# Patient Record
Sex: Female | Born: 1990 | Race: White | Hispanic: No | Marital: Married | Smoking: Never smoker
Health system: Southern US, Community
[De-identification: ages and names within clinical notes are randomized; demographics above are authoritative.]

## PROBLEM LIST (undated history)

## (undated) DIAGNOSIS — L4050 Arthropathic psoriasis, unspecified: Secondary | ICD-10-CM

## (undated) DIAGNOSIS — L409 Psoriasis, unspecified: Secondary | ICD-10-CM

## (undated) DIAGNOSIS — M419 Scoliosis, unspecified: Secondary | ICD-10-CM

## (undated) HISTORY — PX: NASAL SINUS SURGERY: SHX719

## (undated) HISTORY — DX: Psoriasis, unspecified: L40.9

## (undated) HISTORY — DX: Scoliosis, unspecified: M41.9

## (undated) HISTORY — PX: NASAL SEPTUM SURGERY: SHX37

## (undated) HISTORY — DX: Arthropathic psoriasis, unspecified: L40.50

---

## 2014-08-25 LAB — LIPID PANEL
CHOLESTEROL: 146 mg/dL (ref 0–200)
HDL: 60 mg/dL (ref 35–70)
LDL CALC: 76 mg/dL
Triglycerides: 50 mg/dL (ref 40–160)

## 2014-08-25 LAB — BASIC METABOLIC PANEL: Glucose: 84 mg/dL

## 2016-02-05 ENCOUNTER — Encounter: Payer: Self-pay | Admitting: Obstetrics & Gynecology

## 2016-02-05 ENCOUNTER — Ambulatory Visit (INDEPENDENT_AMBULATORY_CARE_PROVIDER_SITE_OTHER): Payer: BLUE CROSS/BLUE SHIELD | Admitting: Obstetrics & Gynecology

## 2016-02-05 VITALS — BP 118/65 | HR 65 | Resp 16 | Ht 63.0 in | Wt 118.0 lb

## 2016-02-05 DIAGNOSIS — Z124 Encounter for screening for malignant neoplasm of cervix: Secondary | ICD-10-CM

## 2016-02-05 DIAGNOSIS — Z3041 Encounter for surveillance of contraceptive pills: Secondary | ICD-10-CM | POA: Diagnosis not present

## 2016-02-05 DIAGNOSIS — L409 Psoriasis, unspecified: Secondary | ICD-10-CM | POA: Diagnosis not present

## 2016-02-05 DIAGNOSIS — Z01419 Encounter for gynecological examination (general) (routine) without abnormal findings: Secondary | ICD-10-CM | POA: Diagnosis not present

## 2016-02-05 MED ORDER — MISOPROSTOL 200 MCG PO TABS: ORAL_TABLET | ORAL | Status: DC

## 2016-02-05 NOTE — Progress Notes (Signed)
  Subjective:     Maria Kelly is a 25 y.o. female here for a routine exam.  Current complaints: Thinking of IUD instead of pills. Has regualr menses each month with no problems  Gynecologic History Patient's last menstrual period was 01/15/2016. Contraception: OCP (estrogen/progesterone) Last Pap: 2015 in [place]. Results were: normal per pt  Obstetric History OB History  Gravida Para Term Preterm AB SAB TAB Ectopic Multiple Living  0 0 0 0 0 0 0 0 0 0          The following portions of the patient's history were reviewed and updated as appropriate: allergies, current medications, past family history, past medical history, past social history, past surgical history and problem list.  Review of Systems Pertinent items noted in HPI and remainder of comprehensive ROS otherwise negative.    Objective:      Filed Vitals:   02/05/16 1508  BP: 118/65  Pulse: 65  Resp: 16  Height: 5\' 3"  (1.6 m)  Weight: 118 lb (53.524 kg)   Vitals:  WNL General appearance: alert, cooperative and no distress  HEENT: Normocephalic, without obvious abnormality, atraumatic Eyes: negative Throat: lips, mucosa, and tongue normal; teeth and gums normal  Respiratory: Clear to auscultation bilaterally  CV: Regular rate and rhythm  Breasts:  Normal appearance, no masses or tenderness, no nipple retraction or dimpling  GI: Soft, non-tender; bowel sounds normal; no masses,  no organomegaly  GU: External Genitalia:  Tanner V, no lesion Urethra:  No prolapse   Vagina: Pink, normal rugae, no blood or discharge  Cervix: No CMT, no lesion  Uterus:  Normal size and contour, non tender  Adnexa: Normal, no masses, non tender  Musculoskeletal: No edema, redness or tenderness in the calves or thighs  Skin: No lesions or rash  Lymphatic: Axillary adenopathy: none    Psychiatric: Normal mood and behavior     Assessment:    Healthy female exam.   Wants Skyla   Plan:    Education reviewed: skin  cancer screening. Contraception: OCP (estrogen/progesterone). Follow up in: 2 weeks.    [facility] Aleve prior to insertion.

## 2016-02-07 LAB — CYTOLOGY - PAP

## 2016-02-13 ENCOUNTER — Ambulatory Visit: Payer: BLUE CROSS/BLUE SHIELD | Admitting: Obstetrics & Gynecology

## 2016-04-15 ENCOUNTER — Encounter: Payer: Self-pay | Admitting: Certified Nurse Midwife

## 2016-04-15 ENCOUNTER — Ambulatory Visit (INDEPENDENT_AMBULATORY_CARE_PROVIDER_SITE_OTHER): Payer: BLUE CROSS/BLUE SHIELD | Admitting: Certified Nurse Midwife

## 2016-04-15 VITALS — BP 130/78 | HR 73 | Wt 115.0 lb

## 2016-04-15 DIAGNOSIS — Z113 Encounter for screening for infections with a predominantly sexual mode of transmission: Secondary | ICD-10-CM | POA: Diagnosis not present

## 2016-04-15 DIAGNOSIS — Z36 Encounter for antenatal screening of mother: Secondary | ICD-10-CM | POA: Diagnosis not present

## 2016-04-15 DIAGNOSIS — O9989 Other specified diseases and conditions complicating pregnancy, childbirth and the puerperium: Secondary | ICD-10-CM | POA: Diagnosis not present

## 2016-04-15 DIAGNOSIS — Z23 Encounter for immunization: Secondary | ICD-10-CM

## 2016-04-15 DIAGNOSIS — Z331 Pregnant state, incidental: Secondary | ICD-10-CM | POA: Diagnosis not present

## 2016-04-15 DIAGNOSIS — O26899 Other specified pregnancy related conditions, unspecified trimester: Secondary | ICD-10-CM | POA: Insufficient documentation

## 2016-04-15 DIAGNOSIS — R109 Unspecified abdominal pain: Secondary | ICD-10-CM

## 2016-04-15 DIAGNOSIS — Z3490 Encounter for supervision of normal pregnancy, unspecified, unspecified trimester: Secondary | ICD-10-CM

## 2016-04-15 DIAGNOSIS — N912 Amenorrhea, unspecified: Secondary | ICD-10-CM | POA: Insufficient documentation

## 2016-04-15 DIAGNOSIS — Z3200 Encounter for pregnancy test, result unknown: Secondary | ICD-10-CM

## 2016-04-15 NOTE — Progress Notes (Signed)
Subjective:     Maria Kelly is a 25 y.o. female here for confirmation of pregnancy.  Current complaints: none. She denies VB. She reports mild cramping. She denies C/D/N/V. She report abdominal bloating and fatigue.   Gynecologic History Patient's last menstrual period was 03/11/2016 (exact date). Contraception: none Last Pap: 02/05/16. Results were: normal Last mammogram: n/a.   Obstetric History OB History  Gravida Para Term Preterm AB Living  1 0 0 0 0 0  SAB TAB Ectopic Multiple Live Births  0 0 0 0      # Outcome Date GA Lbr Len/2nd Weight Sex Delivery Anes PTL Lv  1 Current              The following portions of the patient's history were reviewed and updated as appropriate: allergies, current medications, past family history, past medical history, past social history, past surgical history and problem list.  Review of Systems Pertinent items noted in HPI and remainder of comprehensive ROS otherwise negative.   +abd bloating  +fatigue  +cramping   no VB  Objective:  BP 130/78   Pulse 73   Wt 115 lb (52.2 kg)   LMP 03/11/2016 (Exact Date)   BMI 20.37 kg/m    General appearance: alert, cooperative and no distress Lungs: normal effort and rate Heart: normal rate Abdomen: normal findings: soft, non-tender Skin: Skin color, texture, turgor normal. No rashes or lesions Neurologic: Grossly normal   Bedside US by RN: +IUGS, +YS, no FP, measures ~5w  Assessment:  Early pregnancy Cramping in pregnancy   Plan:  Follow up in 7-10 days for rpt US SAB precautions - First trimester anticipatory guidance - Continue PNV daily  >50% of this 15 min visit spent counseling

## 2016-04-15 NOTE — Progress Notes (Signed)
Pt here today for NOB.  According to LMP and bedside U/S she is only [redacted] weeks pregnant.  Only Gestational sac and yolk sac seen.  Pt will return in 2 weeks for confirmation

## 2016-04-16 LAB — GC/CHLAMYDIA PROBE AMP
CT Probe RNA: NOT DETECTED
GC Probe RNA: NOT DETECTED

## 2016-04-17 LAB — URINE CULTURE: Organism ID, Bacteria: NO GROWTH

## 2016-04-29 ENCOUNTER — Encounter: Payer: Self-pay | Admitting: Obstetrics & Gynecology

## 2016-04-29 ENCOUNTER — Other Ambulatory Visit: Payer: Self-pay | Admitting: Certified Nurse Midwife

## 2016-04-29 ENCOUNTER — Ambulatory Visit (INDEPENDENT_AMBULATORY_CARE_PROVIDER_SITE_OTHER): Payer: BLUE CROSS/BLUE SHIELD | Admitting: Certified Nurse Midwife

## 2016-04-29 VITALS — BP 105/65 | HR 63 | Wt 116.0 lb

## 2016-04-29 DIAGNOSIS — Z3A11 11 weeks gestation of pregnancy: Secondary | ICD-10-CM

## 2016-04-29 DIAGNOSIS — Z3401 Encounter for supervision of normal first pregnancy, first trimester: Secondary | ICD-10-CM

## 2016-04-29 DIAGNOSIS — Z113 Encounter for screening for infections with a predominantly sexual mode of transmission: Secondary | ICD-10-CM | POA: Diagnosis not present

## 2016-04-29 DIAGNOSIS — Z3400 Encounter for supervision of normal first pregnancy, unspecified trimester: Secondary | ICD-10-CM | POA: Insufficient documentation

## 2016-04-29 NOTE — Progress Notes (Signed)
Subjective:  Maria Kelly is a 25 y.o. G1P0000 at 5910w0d being seen today for initial prenatal care.  She is currently monitored for the following issues for this low-risk pregnancy and has Psoriasis and Supervision of normal first pregnancy on her problem list.  Patient reports no complaints.  Contractions: Not present. Vag. Bleeding: None.  Movement: Absent. Denies leaking of fluid.   The following portions of the patient's history were reviewed and updated as appropriate: allergies, current medications, past family history, past medical history, past social history, past surgical history and problem list. Problem list updated.  Objective:   Vitals:   04/29/16 0953  BP: 105/65  Pulse: 63  Weight: 116 lb (52.6 kg)    Fetal Status: Fetal Heart Rate (bpm): 143   Movement: Absent     General:  Alert, oriented and cooperative. Patient is in no acute distress.  Skin: Skin is warm and dry. No rash noted.   Cardiovascular: Normal heart rate noted  Respiratory: Normal respiratory effort, no problems with respiration noted  Abdomen: Soft, gravid, appropriate for gestational age. Pain/Pressure: Absent     Pelvic: Vag. Bleeding: None Vag D/C Character: Thin   Cervical exam deferred        Extremities: Normal range of motion.  Edema: None  Mental Status: Normal mood and affect. Normal behavior. Normal judgment and thought content.   Urinalysis: Urine Protein: Negative Urine Glucose: Negative  Assessment and Plan:  Pregnancy: G1P0000 at 5310w0d  1. Encounter for supervision of normal first pregnancy in first trimester - Urine Culture - GC/Chlamydia Probe Amp - Prenatal (OB Panel)   Preterm labor symptoms and general obstetric precautions including but not limited to vaginal bleeding, contractions, leaking of fluid and fetal movement were reviewed in detail with the patient. Please refer to After Visit Summary for other counseling recommendations.  Return in about 4 weeks (around  05/27/2016).   Donette LarryMelanie Woodard Perrell, CNM

## 2016-04-30 LAB — OBSTETRIC PANEL
ANTIBODY SCREEN: NEGATIVE
BASOS ABS: 0 {cells}/uL (ref 0–200)
Basophils Relative: 0 %
EOS PCT: 2 %
Eosinophils Absolute: 150 cells/uL (ref 15–500)
HCT: 39 % (ref 35.0–45.0)
Hemoglobin: 12.9 g/dL (ref 11.7–15.5)
Hepatitis B Surface Ag: NEGATIVE
LYMPHS PCT: 25 %
Lymphs Abs: 1875 cells/uL (ref 850–3900)
MCH: 29.1 pg (ref 27.0–33.0)
MCHC: 33.1 g/dL (ref 32.0–36.0)
MCV: 87.8 fL (ref 80.0–100.0)
MONOS PCT: 8 %
MPV: 9.3 fL (ref 7.5–12.5)
Monocytes Absolute: 600 cells/uL (ref 200–950)
NEUTROS PCT: 65 %
Neutro Abs: 4875 cells/uL (ref 1500–7800)
PLATELETS: 222 10*3/uL (ref 140–400)
RBC: 4.44 MIL/uL (ref 3.80–5.10)
RDW: 13.1 % (ref 11.0–15.0)
RH TYPE: POSITIVE
Rubella: 4.08 Index — ABNORMAL HIGH (ref <0.90)
WBC: 7.5 10*3/uL (ref 3.8–10.8)

## 2016-04-30 LAB — URINE CULTURE: Organism ID, Bacteria: NO GROWTH

## 2016-04-30 LAB — GC/CHLAMYDIA PROBE AMP
CT PROBE, AMP APTIMA: NOT DETECTED
GC Probe RNA: NOT DETECTED

## 2016-05-01 ENCOUNTER — Telehealth: Payer: Self-pay | Admitting: *Deleted

## 2016-05-01 DIAGNOSIS — O219 Vomiting of pregnancy, unspecified: Secondary | ICD-10-CM

## 2016-05-01 MED ORDER — DOXYLAMINE-PYRIDOXINE 10-10 MG PO TBEC: 1.0000 | DELAYED_RELEASE_TABLET | Freq: Every day | ORAL | 3 refills | Status: DC

## 2016-05-01 NOTE — Telephone Encounter (Signed)
Pt called requesting something for nausea.  She has been in bed only taking sips of ginger ale and eating a few pretzels for 2 days.  She does state that she is voiding some.  RX for Diclegis was sent to her pharmacy per protocol.  Encouraged patient to touch base with us if she is getting worse and sees a decrease in her urinary output.  I explained that if she gets dehydrated she will most likely need IV hydrating fluids.

## 2016-05-27 ENCOUNTER — Encounter: Payer: Self-pay | Admitting: Obstetrics & Gynecology

## 2016-05-27 ENCOUNTER — Ambulatory Visit (INDEPENDENT_AMBULATORY_CARE_PROVIDER_SITE_OTHER): Payer: BLUE CROSS/BLUE SHIELD | Admitting: Advanced Practice Midwife

## 2016-05-27 DIAGNOSIS — Z3401 Encounter for supervision of normal first pregnancy, first trimester: Secondary | ICD-10-CM | POA: Diagnosis not present

## 2016-05-27 DIAGNOSIS — M419 Scoliosis, unspecified: Secondary | ICD-10-CM

## 2016-05-27 NOTE — Progress Notes (Signed)
   PRENATAL VISIT NOTE  Subjective:  Maria Kelly is a 25 y.o. G1P0000 at 7967w0d being seen today for ongoing prenatal care.  She is currently monitored for the following issues for this low-risk pregnancy and has Psoriasis and Supervision of normal first pregnancy on her problem list.  Patient reports no complaints. N/V much better w/ Diclegis.  Contractions: Not present. Vag. Bleeding: None.  Movement: Absent. Denies leaking of fluid.   Asking if she can go to dentist and chiropractor.   The following portions of the patient's history were reviewed and updated as appropriate: allergies, current medications, past family history, past medical history, past social history, past surgical history and problem list. Problem list updated.  Objective:   Vitals:   05/27/16 0828  BP: 107/68  Pulse: 83  Weight: 114 lb (51.7 kg)    Fetal Status: Fetal Heart Rate (bpm): 173   Movement: Absent     General:  Alert, oriented and cooperative. Patient is in no acute distress.  Skin: Skin is warm and dry. No rash noted.   Cardiovascular: Normal heart rate noted  Respiratory: Normal respiratory effort, no problems with respiration noted  Abdomen: Soft, gravid, appropriate for gestational age. Pain/Pressure: Absent     Pelvic:  Cervical exam deferred        Extremities: Normal range of motion.  Edema: None  Mental Status: Normal mood and affect. Normal behavior. Normal judgment and thought content.   Assessment and Plan:  Pregnancy: G1P0000 at 4567w0d  1. Encounter for supervision of normal first pregnancy in first trimester  - HIV antibody - US MFM Fetal Nuchal Translucency; Future  Preterm labor symptoms and general obstetric precautions including but not limited to vaginal bleeding, contractions, leaking of fluid and fetal movement were reviewed in detail with the patient. Please refer to After Visit Summary for other counseling recommendations.  OK to go to dentist and chiropractor. Dental  letter given. Avoid X-rays unless medically necessary right now.  Return in about 4 weeks (around 06/24/2016) for ROB.   Dorathy KinsmanVirginia Desiree Fleming, CNM

## 2016-05-27 NOTE — Patient Instructions (Addendum)

## 2016-05-28 LAB — HIV ANTIBODY (ROUTINE TESTING W REFLEX): HIV: NONREACTIVE

## 2016-06-11 ENCOUNTER — Ambulatory Visit (HOSPITAL_COMMUNITY)
Admission: RE | Admit: 2016-06-11 | Discharge: 2016-06-11 | Disposition: A | Discharge status: Home / Self Care | Payer: BLUE CROSS/BLUE SHIELD | Source: Ambulatory Visit | Attending: Advanced Practice Midwife | Admitting: Advanced Practice Midwife

## 2016-06-11 ENCOUNTER — Encounter (HOSPITAL_COMMUNITY): Payer: Self-pay

## 2016-06-11 DIAGNOSIS — Z3401 Encounter for supervision of normal first pregnancy, first trimester: Secondary | ICD-10-CM

## 2016-06-11 DIAGNOSIS — Z3682 Encounter for antenatal screening for nuchal translucency: Secondary | ICD-10-CM | POA: Insufficient documentation

## 2016-06-11 DIAGNOSIS — Z3A13 13 weeks gestation of pregnancy: Secondary | ICD-10-CM | POA: Insufficient documentation

## 2016-06-12 ENCOUNTER — Other Ambulatory Visit (HOSPITAL_COMMUNITY): Payer: Self-pay | Admitting: *Deleted

## 2016-06-12 DIAGNOSIS — Z3689 Encounter for other specified antenatal screening: Secondary | ICD-10-CM

## 2016-06-18 ENCOUNTER — Encounter: Payer: Self-pay | Admitting: *Deleted

## 2016-06-18 DIAGNOSIS — Z3402 Encounter for supervision of normal first pregnancy, second trimester: Secondary | ICD-10-CM

## 2016-06-20 ENCOUNTER — Other Ambulatory Visit (HOSPITAL_COMMUNITY): Payer: Self-pay

## 2016-06-24 ENCOUNTER — Ambulatory Visit (INDEPENDENT_AMBULATORY_CARE_PROVIDER_SITE_OTHER): Payer: BLUE CROSS/BLUE SHIELD | Admitting: Advanced Practice Midwife

## 2016-06-24 VITALS — BP 107/64 | HR 83 | Wt 115.0 lb

## 2016-06-24 DIAGNOSIS — Z3492 Encounter for supervision of normal pregnancy, unspecified, second trimester: Secondary | ICD-10-CM

## 2016-06-24 DIAGNOSIS — Z3402 Encounter for supervision of normal first pregnancy, second trimester: Secondary | ICD-10-CM

## 2016-06-24 NOTE — Progress Notes (Signed)
   PRENATAL VISIT NOTE  Subjective:  Maria Kelly is a 25 y.o. G1P0000 at 468w0d being seen today for ongoing prenatal care.  She is currently monitored for the following issues for this low-risk pregnancy and has Psoriasis; Supervision of normal first pregnancy; and Scoliosis on her problem list.  Patient reports N/V much better w/ Diclegis and small, frequent meals, but still really needs diclegis. .  Contractions: Not present. Vag. Bleeding: None.  Movement: Absent. Denies leaking of fluid.   The following portions of the patient's history were reviewed and updated as appropriate: allergies, current medications, past family history, past medical history, past social history, past surgical history and problem list. Problem list updated.  Objective:   Vitals:   06/24/16 0833  BP: 107/64  Pulse: 83  Weight: 115 lb (52.2 kg)    Fetal Status: Fetal Heart Rate (bpm): 154 Fundal Height: 15 cm Movement: Absent     General:  Alert, oriented and cooperative. Patient is in no acute distress.  Skin: Skin is warm and dry. No rash noted.   Cardiovascular: Normal heart rate noted  Respiratory: Normal respiratory effort, no problems with respiration noted  Abdomen: Soft, gravid, appropriate for gestational age. Pain/Pressure: Absent     Pelvic:  Cervical exam deferred        Extremities: Normal range of motion.  Edema: None  Mental Status: Normal mood and affect. Normal behavior. Normal judgment and thought content.   Assessment and Plan:  Pregnancy: G1P0000 at 318w0d  1. Normal pregnancy in second trimester  - Alpha fetoprotein, maternal  2. Encounter for supervision of normal first pregnancy in second trimester - Anatomy US scheduled  Preterm labor symptoms and general obstetric precautions including but not limited to vaginal bleeding, contractions, leaking of fluid and fetal movement were reviewed in detail with the patient. Please refer to After Visit Summary for other counseling  recommendations.  Return in about 4 weeks (around 07/22/2016) for ROB.   Dorathy KinsmanVirginia Samaia Iwata, CNM

## 2016-06-24 NOTE — Patient Instructions (Signed)
Second Trimester of Pregnancy The second trimester is from week 13 through week 28 (months 4 through 6). The second trimester is often a time when you feel your best. Your body has also adjusted to being pregnant, and you begin to feel better physically. Usually, morning sickness has lessened or quit completely, you may have more energy, and you may have an increase in appetite. The second trimester is also a time when the fetus is growing rapidly. At the end of the sixth month, the fetus is about 9 inches long and weighs about 1 pounds. You will likely begin to feel the baby move (quickening) between 18 and 20 weeks of the pregnancy. Body changes during your second trimester Your body continues to go through many changes during your second trimester. The changes vary from woman to woman.  Your weight will continue to increase. You will notice your lower abdomen bulging out.  You may begin to get stretch marks on your hips, abdomen, and breasts.  You may develop headaches that can be relieved by medicines. The medicines should be approved by your health care provider.  You may urinate more often because the fetus is pressing on your bladder.  You may develop or continue to have heartburn as a result of your pregnancy.  You may develop constipation because certain hormones are causing the muscles that push waste through your intestines to slow down.  You may develop hemorrhoids or swollen, bulging veins (varicose veins).  You may have back pain. This is caused by:  Weight gain.  Pregnancy hormones that are relaxing the joints in your pelvis.  A shift in weight and the muscles that support your balance.  Your breasts will continue to grow and they will continue to become tender.  Your gums may bleed and may be sensitive to brushing and flossing.  Dark spots or blotches (chloasma, mask of pregnancy) may develop on your face. This will likely fade after the baby is born.  A dark line  from your belly button to the pubic area (linea nigra) may appear. This will likely fade after the baby is born.  You may have changes in your hair. These can include thickening of your hair, rapid growth, and changes in texture. Some women also have hair loss during or after pregnancy, or hair that feels dry or thin. Your hair will most likely return to normal after your baby is born. What to expect at prenatal visits During a routine prenatal visit:  You will be weighed to make sure you and the fetus are growing normally.  Your blood pressure will be taken.  Your abdomen will be measured to track your baby's growth.  The fetal heartbeat will be listened to.  Any test results from the previous visit will be discussed. Your health care provider may ask you:  How you are feeling.  If you are feeling the baby move.  If you have had any abnormal symptoms, such as leaking fluid, bleeding, severe headaches, or abdominal cramping.  If you are using any tobacco products, including cigarettes, chewing tobacco, and electronic cigarettes.  If you have any questions. Other tests that may be performed during your second trimester include:  Blood tests that check for:  Low iron levels (anemia).  Gestational diabetes (between 24 and 28 weeks).  Rh antibodies. This is to check for a protein on red blood cells (Rh factor).  Urine tests to check for infections, diabetes, or protein in the urine.  An ultrasound to   confirm the proper growth and development of the baby.  An amniocentesis to check for possible genetic problems.  Fetal screens for spina bifida and Down syndrome.  HIV (human immunodeficiency virus) testing. Routine prenatal testing includes screening for HIV, unless you choose not to have this test. Follow these instructions at home: Eating and drinking  Continue to eat regular, healthy meals.  Avoid raw meat, uncooked cheese, cat litter boxes, and soil used by cats. These  carry germs that can cause birth defects in the baby.  Take your prenatal vitamins.  Take 1500-2000 mg of calcium daily starting at the 20th week of pregnancy until you deliver your baby.  If you develop constipation:  Take over-the-counter or prescription medicines.  Drink enough fluid to keep your urine clear or pale yellow.  Eat foods that are high in fiber, such as fresh fruits and vegetables, whole grains, and beans.  Limit foods that are high in fat and processed sugars, such as fried and sweet foods. Activity  Exercise only as directed by your health care provider. Experiencing uterine cramps is a good sign to stop exercising.  Avoid heavy lifting, wear low heel shoes, and practice good posture.  Wear your seat belt at all times when driving.  Rest with your legs elevated if you have leg cramps or low back pain.  Wear a good support bra for breast tenderness.  Do not use hot tubs, steam rooms, or saunas. Lifestyle  Avoid all smoking, herbs, alcohol, and unprescribed drugs. These chemicals affect the formation and growth of the baby.  Do not use any products that contain nicotine or tobacco, such as cigarettes and e-cigarettes. If you need help quitting, ask your health care provider.  A sexual relationship may be continued unless your health care provider directs you otherwise. General instructions  Follow your health care provider's instructions regarding medicine use. There are medicines that are either safe or unsafe to take during pregnancy.  Take warm sitz baths to soothe any pain or discomfort caused by hemorrhoids. Use hemorrhoid cream if your health care provider approves.  If you develop varicose veins, wear support hose. Elevate your feet for 15 minutes, 3-4 times a day. Limit salt in your diet.  Visit your dentist if you have not gone yet during your pregnancy. Use a soft toothbrush to brush your teeth and be gentle when you floss.  Keep all follow-up  prenatal visits as told by your health care provider. This is important. Contact a health care provider if:  You have dizziness.  You have mild pelvic cramps, pelvic pressure, or nagging pain in the abdominal area.  You have persistent nausea, vomiting, or diarrhea.  You have a bad smelling vaginal discharge.  You have pain with urination. Get help right away if:  You have a fever.  You are leaking fluid from your vagina.  You have spotting or bleeding from your vagina.  You have severe abdominal cramping or pain.  You have rapid weight gain or weight loss.  You have shortness of breath with chest pain.  You notice sudden or extreme swelling of your face, hands, ankles, feet, or legs.  You have not felt your baby move in over an hour.  You have severe headaches that do not go away with medicine.  You have vision changes. Summary  The second trimester is from week 13 through week 28 (months 4 through 6). It is also a time when the fetus is growing rapidly.  Your body goes   through many changes during pregnancy. The changes vary from woman to woman.  Avoid all smoking, herbs, alcohol, and unprescribed drugs. These chemicals affect the formation and growth your baby.  Do not use any tobacco products, such as cigarettes, chewing tobacco, and e-cigarettes. If you need help quitting, ask your health care provider.  Contact your health care provider if you have any questions. Keep all prenatal visits as told by your health care provider. This is important. This information is not intended to replace advice given to you by your health care provider. Make sure you discuss any questions you have with your health care provider. Document Released: 07/02/2001 Document Revised: 12/14/2015 Document Reviewed: 09/08/2012 Elsevier Interactive Patient Education  2017 [employer]  

## 2016-06-25 LAB — ALPHA FETOPROTEIN, MATERNAL
AFP: 38.3 ng/mL
CURR GEST AGE: 15 wk
MoM for AFP: 1.15
OPEN SPINA BIFIDA: NEGATIVE

## 2016-07-22 NOTE — L&D Delivery Note (Signed)
Delivery Note At 7:45 PM a viable female was delivered via Vaginal, Spontaneous Delivery (Presentation: ROA).  APGAR: 8/9 ; weight 7 lbs 4 oz.   Placenta status: intact via Tomasa BlaseSchultz, marginal cord insertion - undiagnosed.  Cord:  with the following complications: Loose Palmer x 1 loop; easily reduced after delivery of head.  Cord pH: n/a.  Anesthesia: none Episiotomy: none Lacerations: 1st degree;Vaginal Suture Repair: 3.0 vicryl rapide 4.0 vicryl - 1 figure of 8 to tie off hematoma Est. Blood Loss (mL): 150  Mom to postpartum.  Baby to Couplet care / Skin to Skin.  Maria Moraolitta Yeslin Delio, MSN, CNM 12/19/2016, 8:19 PM

## 2016-07-24 ENCOUNTER — Ambulatory Visit (INDEPENDENT_AMBULATORY_CARE_PROVIDER_SITE_OTHER): Payer: BLUE CROSS/BLUE SHIELD | Admitting: Obstetrics & Gynecology

## 2016-07-24 ENCOUNTER — Encounter: Payer: Self-pay | Admitting: Obstetrics & Gynecology

## 2016-07-24 DIAGNOSIS — Z3402 Encounter for supervision of normal first pregnancy, second trimester: Secondary | ICD-10-CM

## 2016-07-24 NOTE — Progress Notes (Signed)
   PRENATAL VISIT NOTE  Subjective:  Maria Kelly is a 26 y.o. G1P0000 at 5357w2d being seen today for ongoing prenatal care.  She is currently monitored for the following issues for this low-risk pregnancy and has Psoriasis; Supervision of normal first pregnancy; and Scoliosis on her problem list.  Patient reports vomit x1 today; feels like getting a cold, no fevers, rigors, chills.  +mild sore throat.  Patient also has one area near left axilla with psoriasis flair.   Contractions: Not present. Vag. Bleeding: None.  Movement: Present. Denies leaking of fluid.   The following portions of the patient's history were reviewed and updated as appropriate: allergies, current medications, past family history, past medical history, past social history, past surgical history and problem list. Problem list updated.  Objective:   Vitals:   07/24/16 0822  BP: 112/71  Pulse: 79  Weight: 117 lb (53.1 kg)    Fetal Status: Fetal Heart Rate (bpm): 148   Movement: Present     General:  Alert, oriented and cooperative. Patient is in no acute distress.  Skin: Skin is warm and dry. No rash noted.   Cardiovascular: Normal heart rate noted  Respiratory: Normal respiratory effort, no problems with respiration noted, CTAB  Abdomen: Soft, gravid, appropriate for gestational age. Pain/Pressure: Absent     Pelvic:  Cervical exam deferred        Extremities: Normal range of motion.  Edema: None  Mental Status: Normal mood and affect. Normal behavior. Normal judgment and thought content.   Assessment and Plan:  Pregnancy: G1P0000 at 6657w2d  1. Encounter for supervision of normal first pregnancy in second trimester -mild URI--Zyrtec for nasal drip, no evidence of flu -weight gain--needs to gain weight; encouraged Ensure or protein shakes -Nausea--pt vomited today, if continues, she will call for Rx of Zofran or phenergan  2.  Psoraisis -Restart topical steroid to affected area.  Preterm labor symptoms and  general obstetric precautions including but not limited to vaginal bleeding, contractions, leaking of fluid and fetal movement were reviewed in detail with the patient. Please refer to After Visit Summary for other counseling recommendations.  No Follow-up on file.   Lesly DukesKelly H Landyn Buckalew, MD

## 2016-07-25 ENCOUNTER — Ambulatory Visit (HOSPITAL_COMMUNITY): Payer: BLUE CROSS/BLUE SHIELD

## 2016-07-25 DIAGNOSIS — M9915 Subluxation complex (vertebral) of pelvic region: Secondary | ICD-10-CM | POA: Diagnosis not present

## 2016-07-25 DIAGNOSIS — M545 Low back pain: Secondary | ICD-10-CM | POA: Diagnosis not present

## 2016-07-25 DIAGNOSIS — M9914 Subluxation complex (vertebral) of sacral region: Secondary | ICD-10-CM | POA: Diagnosis not present

## 2016-07-25 DIAGNOSIS — M9913 Subluxation complex (vertebral) of lumbar region: Secondary | ICD-10-CM | POA: Diagnosis not present

## 2016-07-26 DIAGNOSIS — M9915 Subluxation complex (vertebral) of pelvic region: Secondary | ICD-10-CM | POA: Diagnosis not present

## 2016-07-26 DIAGNOSIS — M9913 Subluxation complex (vertebral) of lumbar region: Secondary | ICD-10-CM | POA: Diagnosis not present

## 2016-07-26 DIAGNOSIS — M9914 Subluxation complex (vertebral) of sacral region: Secondary | ICD-10-CM | POA: Diagnosis not present

## 2016-07-26 DIAGNOSIS — M545 Low back pain: Secondary | ICD-10-CM | POA: Diagnosis not present

## 2016-07-30 ENCOUNTER — Other Ambulatory Visit (HOSPITAL_COMMUNITY): Payer: Self-pay | Admitting: Obstetrics and Gynecology

## 2016-07-30 ENCOUNTER — Ambulatory Visit (HOSPITAL_COMMUNITY)
Admission: RE | Admit: 2016-07-30 | Discharge: 2016-07-30 | Disposition: A | Discharge status: Home / Self Care | Payer: BLUE CROSS/BLUE SHIELD | Source: Ambulatory Visit | Attending: Advanced Practice Midwife | Admitting: Advanced Practice Midwife

## 2016-07-30 DIAGNOSIS — Z3689 Encounter for other specified antenatal screening: Secondary | ICD-10-CM | POA: Diagnosis not present

## 2016-07-30 DIAGNOSIS — Z3A20 20 weeks gestation of pregnancy: Secondary | ICD-10-CM

## 2016-07-30 DIAGNOSIS — M545 Low back pain: Secondary | ICD-10-CM | POA: Diagnosis not present

## 2016-07-30 DIAGNOSIS — M9914 Subluxation complex (vertebral) of sacral region: Secondary | ICD-10-CM | POA: Diagnosis not present

## 2016-07-30 DIAGNOSIS — M9915 Subluxation complex (vertebral) of pelvic region: Secondary | ICD-10-CM | POA: Diagnosis not present

## 2016-07-30 DIAGNOSIS — M9913 Subluxation complex (vertebral) of lumbar region: Secondary | ICD-10-CM | POA: Diagnosis not present

## 2016-08-01 DIAGNOSIS — M9914 Subluxation complex (vertebral) of sacral region: Secondary | ICD-10-CM | POA: Diagnosis not present

## 2016-08-01 DIAGNOSIS — M9915 Subluxation complex (vertebral) of pelvic region: Secondary | ICD-10-CM | POA: Diagnosis not present

## 2016-08-01 DIAGNOSIS — M545 Low back pain: Secondary | ICD-10-CM | POA: Diagnosis not present

## 2016-08-01 DIAGNOSIS — M9913 Subluxation complex (vertebral) of lumbar region: Secondary | ICD-10-CM | POA: Diagnosis not present

## 2016-08-08 DIAGNOSIS — M9914 Subluxation complex (vertebral) of sacral region: Secondary | ICD-10-CM | POA: Diagnosis not present

## 2016-08-08 DIAGNOSIS — M545 Low back pain: Secondary | ICD-10-CM | POA: Diagnosis not present

## 2016-08-08 DIAGNOSIS — M9913 Subluxation complex (vertebral) of lumbar region: Secondary | ICD-10-CM | POA: Diagnosis not present

## 2016-08-08 DIAGNOSIS — M9915 Subluxation complex (vertebral) of pelvic region: Secondary | ICD-10-CM | POA: Diagnosis not present

## 2016-08-09 DIAGNOSIS — M9914 Subluxation complex (vertebral) of sacral region: Secondary | ICD-10-CM | POA: Diagnosis not present

## 2016-08-09 DIAGNOSIS — M9915 Subluxation complex (vertebral) of pelvic region: Secondary | ICD-10-CM | POA: Diagnosis not present

## 2016-08-09 DIAGNOSIS — M9913 Subluxation complex (vertebral) of lumbar region: Secondary | ICD-10-CM | POA: Diagnosis not present

## 2016-08-09 DIAGNOSIS — M545 Low back pain: Secondary | ICD-10-CM | POA: Diagnosis not present

## 2016-08-12 ENCOUNTER — Ambulatory Visit (INDEPENDENT_AMBULATORY_CARE_PROVIDER_SITE_OTHER): Payer: BLUE CROSS/BLUE SHIELD | Admitting: Osteopathic Medicine

## 2016-08-12 ENCOUNTER — Encounter: Payer: Self-pay | Admitting: Osteopathic Medicine

## 2016-08-12 VITALS — BP 116/60 | HR 65 | Ht 62.0 in | Wt 122.0 lb

## 2016-08-12 DIAGNOSIS — Z0000 Encounter for general adult medical examination without abnormal findings: Secondary | ICD-10-CM | POA: Diagnosis not present

## 2016-08-12 NOTE — Progress Notes (Signed)
HPI: Maria Kelly is a 26 y.o. female  who presents to [facility] today, 08/12/16,  for chief complaint of:  Chief Complaint  Patient presents with  . Establish Care    ANNUAL    New patient here to establish care. Currently following with OB/GYN for normal pregnancy, last seen 07/24/2016 at 4483w2d. Currently on Diclegis for pregnancy related nausea, PNV.   No complaints today. Patient presents health screening paperwork as required by her employer/insurance. Will defer lipid screening today due to current pregnancy status. Paper was signed to exams her from the full exam but all other portions of the materials were filled out and given to patient.    Past medical, surgical, social and family history reviewed: Patient Active Problem List   Diagnosis Date Noted  . Scoliosis 05/27/2016  . Supervision of normal first pregnancy 04/29/2016  . Psoriasis 02/05/2016   Past Surgical History:  Procedure Laterality Date  . NASAL SEPTUM SURGERY    . NASAL SINUS SURGERY     Social History  Substance Use Topics  . Smoking status: Never Smoker  . Smokeless tobacco: Never Used  . Alcohol use No     Comment: occassional   Family History  Problem Relation Age of Onset  . Heart attack Paternal Grandfather   . Cancer Paternal Grandfather     brain  . Cancer Maternal Grandfather      Current medication list and allergy/intolerance information reviewed:   Current Outpatient Prescriptions  Medication Sig Dispense Refill  . Doxylamine-Pyridoxine 10-10 MG TBEC Take 1 tablet by mouth at bedtime. 30 tablet 3  . Prenatal Vit-Fe Fumarate-FA (MULTIVITAMIN-PRENATAL) 27-0.8 MG TABS tablet Take 1 tablet by mouth daily at 12 noon.     No current facility-administered medications for this visit.    Allergies  Allergen Reactions  . Keflex [Cephalexin] Hives      Review of Systems:  Constitutional:  No  fever, no chills, No recent illness, No unintentional  weight changes. No significant fatigue.   HEENT: No  headache, no vision change, no hearing change, No sore throat, No  sinus pressure  Cardiac: No  chest pain, No  pressure, No palpitations, No  Orthopnea  Respiratory:  No  shortness of breath. No  Cough  Gastrointestinal: No  abdominal pain, No  nausea, No  vomiting,  No  blood in stool, No  diarrhea, No  constipation   Musculoskeletal: No new myalgia/arthralgia  Genitourinary: No  incontinence, No  abnormal genital bleeding, No abnormal genital discharge  Skin: No  Rash, No other wounds/concerning lesions  Hem/Onc: No  easy bruising/bleeding, No  abnormal lymph node  Endocrine: No cold intolerance,  No heat intolerance. No polyuria/polydipsia/polyphagia   Neurologic: No  weakness, No  dizziness, No  slurred speech/focal weakness/facial droop  Psychiatric: No  concerns with depression, No  concerns with anxiety, No sleep problems, No mood problems  Exam:  BP 116/60   Pulse 65   Ht 5\' 2"  (1.575 m)   Wt 122 lb (55.3 kg)   LMP 03/11/2016 (Exact Date)   BMI 22.31 kg/m   Constitutional: VS see above. General Appearance: alert, well-developed, well-nourished, NAD  Eyes: Normal lids and conjunctive, non-icteric sclera  Ears, Nose, Mouth, Throat: MMM, Normal external inspection ears/nares/mouth/lips/gums. TM normal bilaterally. Pharynx/tonsils no erythema, no exudate. Nasal mucosa normal.   Neck: No masses, trachea midline. No thyroid enlargement. No tenderness/mass appreciated. No lymphadenopathy  Respiratory: Normal respiratory effort. no wheeze, no rhonchi, no rales  Cardiovascular: S1/S2 normal, very faint systolic murmur, no rub/gallop auscultated. RRR. No lower extremity edema.   Musculoskeletal: Gait normal. No clubbing/cyanosis of digits.   Neurological: Normal balance/coordination. No tremor. No cranial nerve deficit on limited exam.   Skin: warm, dry, intact. No rash/ulcer. No concerning nevi or subq nodules on  limited exam.    Psychiatric: Normal judgment/insight. Normal mood and affect. Oriented x3.    No results found for this or any previous visit (from the past 72 hour(s)).  No results found.   ASSESSMENT/PLAN:   No concerns on exam/interview today.   Vaccinations to be updated per OB in 3rd TM  Annual physical exam   FEMALE PREVENTIVE CARE Updated 08/12/16   ANNUAL SCREENING/COUNSELING  Diet/Exercise - HEALTHY HABITS DISCUSSED TO DECREASE CV RISK History  Smoking Status  . Never Smoker  Smokeless Tobacco  . Never Used   History  Alcohol Use No    Comment: occassional   Depression screen PHQ 2/9 08/12/2016  Decreased Interest 0  Down, Depressed, Hopeless 0  PHQ - 2 Score 0    Domestic violence concerns - no  HTN SCREENING - SEE VITALS  SEXUAL HEALTH  Sexually active in the past year - Yes with female.  Need/want STI testing today? - no  Concerns about libido or pain with sex? - no  Plans for pregnancy? - currently pregnant   INFECTIOUS DISEASE SCREENING  HIV - does not need  GC/CT - does not need  HepC - DOB [birth year] - does not need  TB - does not need  DISEASE SCREENING  Lipid - does not need  DM2 - does not need  Osteoporosis - women age 75+ - does not need  CANCER SCREENING  Cervical - does not need  Breast - does not need  Lung - does not need  Colon - does not need  ADULT VACCINATION  Influenza - annual vaccine recommended - UTD at this time  Td - booster every 10 years and w/ 3rd TM pregnancy  Zoster - option at 53, yes at 60+   PCV13 - was not indicated  PPSV23 - was not indicated Immunization History  Administered Date(s) Administered  . Influenza,inj,Quad PF,36+ Mos 04/15/2016       Patient Instructions  Thanks for coming in today!  Let us know if there is anything else we can do for you!  -Dr. Mervyn Skeeters.     Visit summary with medication list and pertinent instructions was printed for patient to review. All  questions at time of visit were answered - patient instructed to contact office with any additional concerns. ER/RTC precautions were reviewed with the patient. Follow-up plan: Return in about 1 year (around 08/12/2017) for annual physical, sooner if needed.

## 2016-08-12 NOTE — Patient Instructions (Addendum)
Thanks for coming in today!  Let us know if there is anything else we can do for you!  -Dr. Mervyn SkeetersA.

## 2016-08-15 ENCOUNTER — Encounter: Payer: Self-pay | Admitting: Osteopathic Medicine

## 2016-08-15 DIAGNOSIS — M9915 Subluxation complex (vertebral) of pelvic region: Secondary | ICD-10-CM | POA: Diagnosis not present

## 2016-08-15 DIAGNOSIS — M545 Low back pain: Secondary | ICD-10-CM | POA: Diagnosis not present

## 2016-08-15 DIAGNOSIS — M9913 Subluxation complex (vertebral) of lumbar region: Secondary | ICD-10-CM | POA: Diagnosis not present

## 2016-08-15 DIAGNOSIS — M9914 Subluxation complex (vertebral) of sacral region: Secondary | ICD-10-CM | POA: Diagnosis not present

## 2016-08-17 DIAGNOSIS — M9913 Subluxation complex (vertebral) of lumbar region: Secondary | ICD-10-CM | POA: Diagnosis not present

## 2016-08-17 DIAGNOSIS — M9914 Subluxation complex (vertebral) of sacral region: Secondary | ICD-10-CM | POA: Diagnosis not present

## 2016-08-17 DIAGNOSIS — M9915 Subluxation complex (vertebral) of pelvic region: Secondary | ICD-10-CM | POA: Diagnosis not present

## 2016-08-17 DIAGNOSIS — M545 Low back pain: Secondary | ICD-10-CM | POA: Diagnosis not present

## 2016-08-21 ENCOUNTER — Ambulatory Visit (INDEPENDENT_AMBULATORY_CARE_PROVIDER_SITE_OTHER): Payer: BLUE CROSS/BLUE SHIELD | Admitting: Obstetrics & Gynecology

## 2016-08-21 ENCOUNTER — Encounter: Payer: Self-pay | Admitting: Obstetrics & Gynecology

## 2016-08-21 DIAGNOSIS — Z3402 Encounter for supervision of normal first pregnancy, second trimester: Secondary | ICD-10-CM

## 2016-08-21 NOTE — Progress Notes (Signed)
   PRENATAL VISIT NOTE  Subjective:  Maria Kelly is a 26 y.o. G1P0000 at 3893w2d being seen today for ongoing prenatal care.  She is currently monitored for the following issues for this low-risk pregnancy and has Psoriasis; Supervision of normal first pregnancy; and Scoliosis on her problem list.  Patient reports right ear pain but better.  Contractions: Not present. Vag. Bleeding: None.  Movement: Present. Denies leaking of fluid.   The following portions of the patient's history were reviewed and updated as appropriate: allergies, current medications, past family history, past medical history, past social history, past surgical history and problem list. Problem list updated.  Objective:   Vitals:   08/21/16 0825  BP: 113/66  Pulse: 76  Weight: 124 lb (56.2 kg)    Fetal Status: Fetal Heart Rate (bpm): 156   Movement: Present     General:  Alert, oriented and cooperative. Patient is in no acute distress.  Skin: Skin is warm and dry. No rash noted.   Cardiovascular: Normal heart rate noted  Respiratory: Normal respiratory effort, no problems with respiration noted  Abdomen: Soft, gravid, appropriate for gestational age. Pain/Pressure: Absent     Pelvic:  Cervical exam deferred        Extremities: Normal range of motion.  Edema: None  Mental Status: Normal mood and affect. Normal behavior. Normal judgment and thought content.  Ears:  TM clear--no evidence of infection  Assessment and Plan:  Pregnancy: G1P0000 at 7493w2d  1. Encounter for supervision of normal first pregnancy in second trimester (lmited anatomy) - US MFM OB FOLLOW UP; Future  Preterm labor symptoms and general obstetric precautions including but not limited to vaginal bleeding, contractions, leaking of fluid and fetal movement were reviewed in detail with the patient. Please refer to After Visit Summary for other counseling recommendations.  Return in about 4 weeks (around 09/18/2016).   Lesly DukesKelly H Kristine Tiley, MD

## 2016-08-22 DIAGNOSIS — M9913 Subluxation complex (vertebral) of lumbar region: Secondary | ICD-10-CM | POA: Diagnosis not present

## 2016-08-22 DIAGNOSIS — M9915 Subluxation complex (vertebral) of pelvic region: Secondary | ICD-10-CM | POA: Diagnosis not present

## 2016-08-22 DIAGNOSIS — M9914 Subluxation complex (vertebral) of sacral region: Secondary | ICD-10-CM | POA: Diagnosis not present

## 2016-08-22 DIAGNOSIS — M545 Low back pain: Secondary | ICD-10-CM | POA: Diagnosis not present

## 2016-08-23 ENCOUNTER — Other Ambulatory Visit: Payer: Self-pay | Admitting: *Deleted

## 2016-08-23 DIAGNOSIS — O219 Vomiting of pregnancy, unspecified: Secondary | ICD-10-CM

## 2016-08-23 MED ORDER — DOXYLAMINE-PYRIDOXINE 10-10 MG PO TBEC: 1.0000 | DELAYED_RELEASE_TABLET | Freq: Every day | ORAL | 3 refills | Status: DC

## 2016-08-28 DIAGNOSIS — M9914 Subluxation complex (vertebral) of sacral region: Secondary | ICD-10-CM | POA: Diagnosis not present

## 2016-08-28 DIAGNOSIS — M9915 Subluxation complex (vertebral) of pelvic region: Secondary | ICD-10-CM | POA: Diagnosis not present

## 2016-08-28 DIAGNOSIS — M9913 Subluxation complex (vertebral) of lumbar region: Secondary | ICD-10-CM | POA: Diagnosis not present

## 2016-08-28 DIAGNOSIS — M545 Low back pain: Secondary | ICD-10-CM | POA: Diagnosis not present

## 2016-08-29 ENCOUNTER — Other Ambulatory Visit: Payer: Self-pay | Admitting: Obstetrics & Gynecology

## 2016-08-29 ENCOUNTER — Ambulatory Visit (HOSPITAL_COMMUNITY)
Admission: RE | Admit: 2016-08-29 | Discharge: 2016-08-29 | Disposition: A | Discharge status: Home / Self Care | Payer: BLUE CROSS/BLUE SHIELD | Source: Ambulatory Visit | Attending: Obstetrics & Gynecology | Admitting: Obstetrics & Gynecology

## 2016-08-29 DIAGNOSIS — Z3A24 24 weeks gestation of pregnancy: Secondary | ICD-10-CM

## 2016-08-29 DIAGNOSIS — Z362 Encounter for other antenatal screening follow-up: Secondary | ICD-10-CM | POA: Diagnosis not present

## 2016-08-29 DIAGNOSIS — Z3402 Encounter for supervision of normal first pregnancy, second trimester: Secondary | ICD-10-CM

## 2016-09-03 DIAGNOSIS — M9914 Subluxation complex (vertebral) of sacral region: Secondary | ICD-10-CM | POA: Diagnosis not present

## 2016-09-03 DIAGNOSIS — M9913 Subluxation complex (vertebral) of lumbar region: Secondary | ICD-10-CM | POA: Diagnosis not present

## 2016-09-03 DIAGNOSIS — M9915 Subluxation complex (vertebral) of pelvic region: Secondary | ICD-10-CM | POA: Diagnosis not present

## 2016-09-03 DIAGNOSIS — M545 Low back pain: Secondary | ICD-10-CM | POA: Diagnosis not present

## 2016-09-11 DIAGNOSIS — M9913 Subluxation complex (vertebral) of lumbar region: Secondary | ICD-10-CM | POA: Diagnosis not present

## 2016-09-11 DIAGNOSIS — M9914 Subluxation complex (vertebral) of sacral region: Secondary | ICD-10-CM | POA: Diagnosis not present

## 2016-09-11 DIAGNOSIS — M9915 Subluxation complex (vertebral) of pelvic region: Secondary | ICD-10-CM | POA: Diagnosis not present

## 2016-09-11 DIAGNOSIS — M545 Low back pain: Secondary | ICD-10-CM | POA: Diagnosis not present

## 2016-09-18 ENCOUNTER — Ambulatory Visit (INDEPENDENT_AMBULATORY_CARE_PROVIDER_SITE_OTHER): Payer: BLUE CROSS/BLUE SHIELD | Admitting: Obstetrics & Gynecology

## 2016-09-18 VITALS — BP 112/69 | HR 84 | Wt 129.0 lb

## 2016-09-18 DIAGNOSIS — M9915 Subluxation complex (vertebral) of pelvic region: Secondary | ICD-10-CM | POA: Diagnosis not present

## 2016-09-18 DIAGNOSIS — M9913 Subluxation complex (vertebral) of lumbar region: Secondary | ICD-10-CM | POA: Diagnosis not present

## 2016-09-18 DIAGNOSIS — Z3492 Encounter for supervision of normal pregnancy, unspecified, second trimester: Secondary | ICD-10-CM

## 2016-09-18 DIAGNOSIS — M545 Low back pain: Secondary | ICD-10-CM | POA: Diagnosis not present

## 2016-09-18 DIAGNOSIS — M9914 Subluxation complex (vertebral) of sacral region: Secondary | ICD-10-CM | POA: Diagnosis not present

## 2016-09-18 DIAGNOSIS — Z3402 Encounter for supervision of normal first pregnancy, second trimester: Secondary | ICD-10-CM

## 2016-09-18 DIAGNOSIS — Z23 Encounter for immunization: Secondary | ICD-10-CM

## 2016-09-18 LAB — CBC
HEMATOCRIT: 35.1 % (ref 35.0–45.0)
HEMOGLOBIN: 11.8 g/dL (ref 11.7–15.5)
MCH: 30.5 pg (ref 27.0–33.0)
MCHC: 33.6 g/dL (ref 32.0–36.0)
MCV: 90.7 fL (ref 80.0–100.0)
MPV: 10.2 fL (ref 7.5–12.5)
Platelets: 203 10*3/uL (ref 140–400)
RBC: 3.87 MIL/uL (ref 3.80–5.10)
RDW: 13.4 % (ref 11.0–15.0)
WBC: 10.4 10*3/uL (ref 3.8–10.8)

## 2016-09-18 NOTE — Progress Notes (Signed)
   PRENATAL VISIT NOTE  Subjective:  Maria Kelly is a 26 y.o. G1P0000 at 8648w2d being seen today for ongoing prenatal care.  She is currently monitored for the following issues for this low-risk pregnancy and has Psoriasis; Supervision of normal first pregnancy; and Scoliosis on her problem list.  Patient reports light headedness after tdap injection and taking GTT.  Contractions: Not present. Vag. Bleeding: None.  Movement: Present. Denies leaking of fluid.   The following portions of the patient's history were reviewed and updated as appropriate: allergies, current medications, past family history, past medical history, past social history, past surgical history and problem list. Problem list updated.  Objective:   Vitals:   09/18/16 0824  BP: 112/69  Pulse: 84  Weight: 129 lb (58.5 kg)    Fetal Status: Fetal Heart Rate (bpm): 141 Fundal Height: 27 cm Movement: Present     General:  Alert, oriented and cooperative. Patient is in no acute distress.  Skin: Skin is warm and dry. No rash noted.   Cardiovascular: Normal heart rate noted  Respiratory: Normal respiratory effort, no problems with respiration noted  Abdomen: Soft, gravid, appropriate for gestational age. Pain/Pressure: Absent     Pelvic:  Cervical exam deferred        Extremities: Normal range of motion.  Edema: None  Mental Status: Normal mood and affect. Normal behavior. Normal judgment and thought content.   Assessment and Plan:  Pregnancy: G1P0000 at 4348w2d  1. Normal pregnancy in second trimester - Glucose Tolerance, 2 Hours w/1 Hour - CBC - HIV antibody (with reflex) - RPR - Tdap vaccine greater than or equal to 7yo IM  Preterm labor symptoms and general obstetric precautions including but not limited to vaginal bleeding, contractions, leaking of fluid and fetal movement were reviewed in detail with the patient. Please refer to After Visit Summary for other counseling recommendations.  No Follow-up on  file.   Lesly DukesKelly H Kron Everton, MD

## 2016-09-19 LAB — RPR

## 2016-09-19 LAB — HIV ANTIBODY (ROUTINE TESTING W REFLEX): HIV: NONREACTIVE

## 2016-09-19 LAB — GLUCOSE TOLERANCE, 2 HOURS W/ 1HR
GLUCOSE, FASTING: 65 mg/dL (ref 65–99)
Glucose, 1 hour: 107 mg/dL
Glucose, 2 hour: 100 mg/dL (ref <140)

## 2016-09-28 DIAGNOSIS — M9914 Subluxation complex (vertebral) of sacral region: Secondary | ICD-10-CM | POA: Diagnosis not present

## 2016-09-28 DIAGNOSIS — M545 Low back pain: Secondary | ICD-10-CM | POA: Diagnosis not present

## 2016-09-28 DIAGNOSIS — M9913 Subluxation complex (vertebral) of lumbar region: Secondary | ICD-10-CM | POA: Diagnosis not present

## 2016-09-28 DIAGNOSIS — M9915 Subluxation complex (vertebral) of pelvic region: Secondary | ICD-10-CM | POA: Diagnosis not present

## 2016-10-02 ENCOUNTER — Encounter: Payer: BLUE CROSS/BLUE SHIELD | Admitting: Obstetrics & Gynecology

## 2016-10-04 DIAGNOSIS — M9914 Subluxation complex (vertebral) of sacral region: Secondary | ICD-10-CM | POA: Diagnosis not present

## 2016-10-04 DIAGNOSIS — M9913 Subluxation complex (vertebral) of lumbar region: Secondary | ICD-10-CM | POA: Diagnosis not present

## 2016-10-04 DIAGNOSIS — M545 Low back pain: Secondary | ICD-10-CM | POA: Diagnosis not present

## 2016-10-04 DIAGNOSIS — M9915 Subluxation complex (vertebral) of pelvic region: Secondary | ICD-10-CM | POA: Diagnosis not present

## 2016-10-09 ENCOUNTER — Ambulatory Visit (INDEPENDENT_AMBULATORY_CARE_PROVIDER_SITE_OTHER): Payer: BLUE CROSS/BLUE SHIELD | Admitting: Obstetrics & Gynecology

## 2016-10-09 DIAGNOSIS — O219 Vomiting of pregnancy, unspecified: Secondary | ICD-10-CM

## 2016-10-09 DIAGNOSIS — Z3403 Encounter for supervision of normal first pregnancy, third trimester: Secondary | ICD-10-CM

## 2016-10-09 DIAGNOSIS — Z3402 Encounter for supervision of normal first pregnancy, second trimester: Secondary | ICD-10-CM

## 2016-10-09 MED ORDER — ONDANSETRON HCL 8 MG PO TABS: 8.0000 mg | ORAL_TABLET | Freq: Three times a day (TID) | ORAL | 0 refills | Status: DC | PRN

## 2016-10-09 MED ORDER — DOXYLAMINE-PYRIDOXINE 10-10 MG PO TBEC: DELAYED_RELEASE_TABLET | ORAL | 3 refills | Status: DC

## 2016-10-09 NOTE — Progress Notes (Signed)
   PRENATAL VISIT NOTE  Subjective:  Maria Kelly is a 26 y.o. G1P0000 at 4874w2d being seen today for ongoing prenatal care.  She is currently monitored for the following issues for this low-risk pregnancy and has Psoriasis; Supervision of normal first pregnancy; and Scoliosis on her problem list.  Patient reports nausea and vomiting.  Contractions: Not present. Vag. Bleeding: None.  Movement: Present. Denies leaking of fluid.   The following portions of the patient's history were reviewed and updated as appropriate: allergies, current medications, past family history, past medical history, past social history, past surgical history and problem list. Problem list updated.  Objective:   Vitals:   10/09/16 0904  BP: 108/74  Pulse: (!) 110  Weight: 134 lb (60.8 kg)    Fetal Status: Fetal Heart Rate (bpm): 145  Fundal Height: 30 cm Movement: Present     General:  Alert, oriented and cooperative. Patient is in no acute distress.  Skin: Skin is warm and dry. No rash noted.   Cardiovascular: Normal heart rate noted  Respiratory: Normal respiratory effort, no problems with respiration noted  Abdomen: Soft, gravid, appropriate for gestational age. Pain/Pressure: Absent     Pelvic:  Cervical exam deferred        Extremities: Normal range of motion.  Edema: None  Mental Status: Normal mood and affect. Normal behavior. Normal judgment and thought content.   Assessment and Plan:  Pregnancy: G1P0000 at 3474w2d  1. Encounter for supervision of normal first pregnancy in third trimester   2. Nausea/vomiting in pregnancy Nausea with some vomiting mainly occurring in the mornings. Advised to increase Doxylamine-Pyridoxine to two tablets at night and maintain the one tablet in the morning.  Also will add Zofran to the regimen to be taken during the day. As Doxylamine-Pyridoxine can cause sleepiness. Advised to take 4 mg of Zofran for the first dose if that works can maintain, if need can take 8 mg  the next dose.   - Doxylamine-Pyridoxine 10-10 MG TBEC; Take two tablets at night and one tablet in the morning.  Dispense: 90 tablet; Refill: 3  Preterm labor symptoms and general obstetric precautions including but not limited to vaginal bleeding, contractions, leaking of fluid and fetal movement were reviewed in detail with the patient. Please refer to After Visit Summary for other counseling recommendations.  Return in about 2 weeks (around 10/23/2016).   Tereasa CoopHannah Vontrell Pullman, RN

## 2016-10-12 DIAGNOSIS — M9913 Subluxation complex (vertebral) of lumbar region: Secondary | ICD-10-CM | POA: Diagnosis not present

## 2016-10-12 DIAGNOSIS — M545 Low back pain: Secondary | ICD-10-CM | POA: Diagnosis not present

## 2016-10-12 DIAGNOSIS — M9915 Subluxation complex (vertebral) of pelvic region: Secondary | ICD-10-CM | POA: Diagnosis not present

## 2016-10-12 DIAGNOSIS — M9914 Subluxation complex (vertebral) of sacral region: Secondary | ICD-10-CM | POA: Diagnosis not present

## 2016-10-17 DIAGNOSIS — M9913 Subluxation complex (vertebral) of lumbar region: Secondary | ICD-10-CM | POA: Diagnosis not present

## 2016-10-17 DIAGNOSIS — M9914 Subluxation complex (vertebral) of sacral region: Secondary | ICD-10-CM | POA: Diagnosis not present

## 2016-10-17 DIAGNOSIS — M9915 Subluxation complex (vertebral) of pelvic region: Secondary | ICD-10-CM | POA: Diagnosis not present

## 2016-10-17 DIAGNOSIS — M545 Low back pain: Secondary | ICD-10-CM | POA: Diagnosis not present

## 2016-10-24 DIAGNOSIS — M9914 Subluxation complex (vertebral) of sacral region: Secondary | ICD-10-CM | POA: Diagnosis not present

## 2016-10-24 DIAGNOSIS — M9913 Subluxation complex (vertebral) of lumbar region: Secondary | ICD-10-CM | POA: Diagnosis not present

## 2016-10-24 DIAGNOSIS — M9915 Subluxation complex (vertebral) of pelvic region: Secondary | ICD-10-CM | POA: Diagnosis not present

## 2016-10-24 DIAGNOSIS — M545 Low back pain: Secondary | ICD-10-CM | POA: Diagnosis not present

## 2016-10-25 ENCOUNTER — Ambulatory Visit (INDEPENDENT_AMBULATORY_CARE_PROVIDER_SITE_OTHER): Payer: BLUE CROSS/BLUE SHIELD | Admitting: Advanced Practice Midwife

## 2016-10-25 DIAGNOSIS — Z3403 Encounter for supervision of normal first pregnancy, third trimester: Secondary | ICD-10-CM

## 2016-10-25 NOTE — Progress Notes (Signed)
   PRENATAL VISIT NOTE  Subjective:  Maria Kelly is a 26 y.o. G1P0000 at [redacted]w[redacted]d being seen today for ongoing prenatal care.  She is currently monitored for the following issues for this low-risk pregnancy and has Psoriasis; Supervision of normal first pregnancy; and Scoliosis on her problem list.  Patient reports no complaints.  Contractions: Not present. Vag. Bleeding: None.  Movement: Present. Denies leaking of fluid.   The following portions of the patient's history were reviewed and updated as appropriate: allergies, current medications, past family history, past medical history, past social history, past surgical history and problem list. Problem list updated.  Objective:   Vitals:   10/25/16 0847  BP: 107/63  Pulse: 76  Weight: 135 lb (61.2 kg)    Fetal Status: Fetal Heart Rate (bpm): 152 Fundal Height: 31 cm Movement: Present  Presentation: Vertex  General:  Alert, oriented and cooperative. Patient is in no acute distress.  Skin: Skin is warm and dry. No rash noted.   Cardiovascular: Normal heart rate noted  Respiratory: Normal respiratory effort, no problems with respiration noted  Abdomen: Soft, gravid, appropriate for gestational age. Pain/Pressure: Absent     Pelvic:  Cervical exam deferred        Extremities: Normal range of motion.  Edema: None  Mental Status: Normal mood and affect. Normal behavior. Normal judgment and thought content.   Assessment and Plan:  Pregnancy: G1P0000 at [redacted]w[redacted]d  1. Encounter for supervision of normal first pregnancy in third trimester - Waterbirth class done. Discussed study. Handout given.   Preterm labor symptoms and general obstetric precautions including but not limited to vaginal bleeding, contractions, leaking of fluid and fetal movement were reviewed in detail with the patient. Please refer to After Visit Summary for other counseling recommendations.  Return in about 2 weeks (around 11/08/2016) for ROB.   Dorathy Kinsman, CNM

## 2016-10-25 NOTE — Patient Instructions (Signed)

## 2016-10-31 DIAGNOSIS — M9915 Subluxation complex (vertebral) of pelvic region: Secondary | ICD-10-CM | POA: Diagnosis not present

## 2016-10-31 DIAGNOSIS — M9913 Subluxation complex (vertebral) of lumbar region: Secondary | ICD-10-CM | POA: Diagnosis not present

## 2016-10-31 DIAGNOSIS — M9914 Subluxation complex (vertebral) of sacral region: Secondary | ICD-10-CM | POA: Diagnosis not present

## 2016-10-31 DIAGNOSIS — M545 Low back pain: Secondary | ICD-10-CM | POA: Diagnosis not present

## 2016-11-07 DIAGNOSIS — M9915 Subluxation complex (vertebral) of pelvic region: Secondary | ICD-10-CM | POA: Diagnosis not present

## 2016-11-07 DIAGNOSIS — M9914 Subluxation complex (vertebral) of sacral region: Secondary | ICD-10-CM | POA: Diagnosis not present

## 2016-11-07 DIAGNOSIS — M545 Low back pain: Secondary | ICD-10-CM | POA: Diagnosis not present

## 2016-11-07 DIAGNOSIS — M9913 Subluxation complex (vertebral) of lumbar region: Secondary | ICD-10-CM | POA: Diagnosis not present

## 2016-11-08 ENCOUNTER — Ambulatory Visit (INDEPENDENT_AMBULATORY_CARE_PROVIDER_SITE_OTHER): Payer: BLUE CROSS/BLUE SHIELD | Admitting: Family

## 2016-11-08 VITALS — BP 110/63 | HR 81 | Wt 137.0 lb

## 2016-11-08 DIAGNOSIS — Z3403 Encounter for supervision of normal first pregnancy, third trimester: Secondary | ICD-10-CM

## 2016-11-08 NOTE — Progress Notes (Signed)
   PRENATAL VISIT NOTE  Subjective:  Maria Kelly is a 26 y.o. G1P0000 at [redacted]w[redacted]d being seen today for ongoing prenatal care.  She is currently monitored for the following issues for this low-risk pregnancy and has Psoriasis; Supervision of normal first pregnancy; and Scoliosis on her problem list.  Patient reports tailbone pain sitting in chair at work.  Contractions: Regular. Vag. Bleeding: None.  Movement: Present. Denies leaking of fluid.   The following portions of the patient's history were reviewed and updated as appropriate: allergies, current medications, past family history, past medical history, past social history, past surgical history and problem list. Problem list updated.  Objective:   Vitals:   11/08/16 0838  BP: 110/63  Pulse: 81  Weight: 137 lb (62.1 kg)    Fetal Status: Fetal Heart Rate (bpm): 139 (Simultaneous filing. User may not have seen previous data.) Fundal Height: 33 cm Movement: Present     General:  Alert, oriented and cooperative. Patient is in no acute distress.  Skin: Skin is warm and dry. No rash noted.   Cardiovascular: Normal heart rate noted  Respiratory: Normal respiratory effort, no problems with respiration noted  Abdomen: Soft, gravid, appropriate for gestational age. Pain/Pressure: Absent     Pelvic:  Cervical exam deferred        Extremities: Normal range of motion.  Edema: None  Mental Status: Normal mood and affect. Normal behavior. Normal judgment and thought content.   Assessment and Plan:  Pregnancy: G1P0000 at [redacted]w[redacted]d  1. Encounter for supervision of normal first pregnancy in third trimester - Discussed utilization of donut pillow - Reviewed GBS and GC/CT for next visit  Preterm labor symptoms and general obstetric precautions including but not limited to vaginal bleeding, contractions, leaking of fluid and fetal movement were reviewed in detail with the patient. Please refer to After Visit Summary for other counseling  recommendations.  Return in about 2 weeks (around 11/22/2016).   Eino Farber Kennith Gain, CNM

## 2016-11-14 DIAGNOSIS — M9913 Subluxation complex (vertebral) of lumbar region: Secondary | ICD-10-CM | POA: Diagnosis not present

## 2016-11-14 DIAGNOSIS — M9914 Subluxation complex (vertebral) of sacral region: Secondary | ICD-10-CM | POA: Diagnosis not present

## 2016-11-14 DIAGNOSIS — M545 Low back pain: Secondary | ICD-10-CM | POA: Diagnosis not present

## 2016-11-14 DIAGNOSIS — M9915 Subluxation complex (vertebral) of pelvic region: Secondary | ICD-10-CM | POA: Diagnosis not present

## 2016-11-21 DIAGNOSIS — M9913 Subluxation complex (vertebral) of lumbar region: Secondary | ICD-10-CM | POA: Diagnosis not present

## 2016-11-21 DIAGNOSIS — M9915 Subluxation complex (vertebral) of pelvic region: Secondary | ICD-10-CM | POA: Diagnosis not present

## 2016-11-21 DIAGNOSIS — M545 Low back pain: Secondary | ICD-10-CM | POA: Diagnosis not present

## 2016-11-21 DIAGNOSIS — M9914 Subluxation complex (vertebral) of sacral region: Secondary | ICD-10-CM | POA: Diagnosis not present

## 2016-11-22 ENCOUNTER — Ambulatory Visit (INDEPENDENT_AMBULATORY_CARE_PROVIDER_SITE_OTHER): Payer: BLUE CROSS/BLUE SHIELD | Admitting: Advanced Practice Midwife

## 2016-11-22 VITALS — BP 115/72 | HR 69 | Wt 138.0 lb

## 2016-11-22 DIAGNOSIS — Z3403 Encounter for supervision of normal first pregnancy, third trimester: Secondary | ICD-10-CM | POA: Diagnosis not present

## 2016-11-22 DIAGNOSIS — Z113 Encounter for screening for infections with a predominantly sexual mode of transmission: Secondary | ICD-10-CM | POA: Diagnosis not present

## 2016-11-22 DIAGNOSIS — Z3A36 36 weeks gestation of pregnancy: Secondary | ICD-10-CM

## 2016-11-24 LAB — CULTURE, BETA STREP (GROUP B ONLY)

## 2016-11-25 ENCOUNTER — Encounter (HOSPITAL_COMMUNITY): Payer: Self-pay | Admitting: Advanced Practice Midwife

## 2016-11-25 DIAGNOSIS — B951 Streptococcus, group B, as the cause of diseases classified elsewhere: Secondary | ICD-10-CM | POA: Insufficient documentation

## 2016-11-25 DIAGNOSIS — O98819 Other maternal infectious and parasitic diseases complicating pregnancy, unspecified trimester: Secondary | ICD-10-CM

## 2016-11-25 LAB — GC/CHLAMYDIA PROBE AMP (~~LOC~~) NOT AT ARMC
CHLAMYDIA, DNA PROBE: NEGATIVE
NEISSERIA GONORRHEA: NEGATIVE

## 2016-11-25 NOTE — Patient Instructions (Signed)
Third Trimester of Pregnancy The third trimester is from week 28 through week 40 (months 7 through 9). The third trimester is a time when the unborn baby (fetus) is growing rapidly. At the end of the ninth month, the fetus is about 20 inches in length and weighs 6-10 pounds. Body changes during your third trimester Your body will continue to go through many changes during pregnancy. The changes vary from woman to woman. During the third trimester:  Your weight will continue to increase. You can expect to gain 25-35 pounds (11-16 kg) by the end of the pregnancy.  You may begin to get stretch marks on your hips, abdomen, and breasts.  You may urinate more often because the fetus is moving lower into your pelvis and pressing on your bladder.  You may develop or continue to have heartburn. This is caused by increased hormones that slow down muscles in the digestive tract.  You may develop or continue to have constipation because increased hormones slow digestion and cause the muscles that push waste through your intestines to relax.  You may develop hemorrhoids. These are swollen veins (varicose veins) in the rectum that can itch or be painful.  You may develop swollen, bulging veins (varicose veins) in your legs.  You may have increased body aches in the pelvis, back, or thighs. This is due to weight gain and increased hormones that are relaxing your joints.  You may have changes in your hair. These can include thickening of your hair, rapid growth, and changes in texture. Some women also have hair loss during or after pregnancy, or hair that feels dry or thin. Your hair will most likely return to normal after your baby is born.  Your breasts will continue to grow and they will continue to become tender. A yellow fluid (colostrum) may leak from your breasts. This is the first milk you are producing for your baby.  Your belly button may stick out.  You may notice more swelling in your hands,  face, or ankles.  You may have increased tingling or numbness in your hands, arms, and legs. The skin on your belly may also feel numb.  You may feel short of breath because of your expanding uterus.  You may have more problems sleeping. This can be caused by the size of your belly, increased need to urinate, and an increase in your body's metabolism.  You may notice the fetus "dropping," or moving lower in your abdomen (lightening).  You may have increased vaginal discharge.  You may notice your joints feel loose and you may have pain around your pelvic bone.  What to expect at prenatal visits You will have prenatal exams every 2 weeks until week 36. Then you will have weekly prenatal exams. During a routine prenatal visit:  You will be weighed to make sure you and the baby are growing normally.  Your blood pressure will be taken.  Your abdomen will be measured to track your baby's growth.  The fetal heartbeat will be listened to.  Any test results from the previous visit will be discussed.  You may have a cervical check near your due date to see if your cervix has softened or thinned (effaced).  You will be tested for Group B streptococcus. This happens between 35 and 37 weeks.  Your health care provider may ask you:  What your birth plan is.  How you are feeling.  If you are feeling the baby move.  If you have had   any abnormal symptoms, such as leaking fluid, bleeding, severe headaches, or abdominal cramping.  If you are using any tobacco products, including cigarettes, chewing tobacco, and electronic cigarettes.  If you have any questions.  Other tests or screenings that may be performed during your third trimester include:  Blood tests that check for low iron levels (anemia).  Fetal testing to check the health, activity level, and growth of the fetus. Testing is done if you have certain medical conditions or if there are problems during the  pregnancy.  Nonstress test (NST). This test checks the health of your baby to make sure there are no signs of problems, such as the baby not getting enough oxygen. During this test, a belt is placed around your belly. The baby is made to move, and its heart rate is monitored during movement.  What is false labor? False labor is a condition in which you feel small, irregular tightenings of the muscles in the womb (contractions) that usually go away with rest, changing position, or drinking water. These are called Braxton Hicks contractions. Contractions may last for hours, days, or even weeks before true labor sets in. If contractions come at regular intervals, become more frequent, increase in intensity, or become painful, you should see your health care provider. What are the signs of labor?  Abdominal cramps.  Regular contractions that start at 10 minutes apart and become stronger and more frequent with time.  Contractions that start on the top of the uterus and spread down to the lower abdomen and back.  Increased pelvic pressure and dull back pain.  A watery or bloody mucus discharge that comes from the vagina.  Leaking of amniotic fluid. This is also known as your "water breaking." It could be a slow trickle or a gush. Let your health care provider know if it has a color or strange odor. If you have any of these signs, call your health care provider right away, even if it is before your due date. Follow these instructions at home: Medicines  Follow your health care provider's instructions regarding medicine use. Specific medicines may be either safe or unsafe to take during pregnancy.  Take a prenatal vitamin that contains at least 600 micrograms (mcg) of folic acid.  If you develop constipation, try taking a stool softener if your health care provider approves. Eating and drinking  Eat a balanced diet that includes fresh fruits and vegetables, whole grains, good sources of protein  such as meat, eggs, or tofu, and low-fat dairy. Your health care provider will help you determine the amount of weight gain that is right for you.  Avoid raw meat and uncooked cheese. These carry germs that can cause birth defects in the baby.  If you have low calcium intake from food, talk to your health care provider about whether you should take a daily calcium supplement.  Eat four or five small meals rather than three large meals a day.  Limit foods that are high in fat and processed sugars, such as fried and sweet foods.  To prevent constipation: ? Drink enough fluid to keep your urine clear or pale yellow. ? Eat foods that are high in fiber, such as fresh fruits and vegetables, whole grains, and beans. Activity  Exercise only as directed by your health care provider. Most women can continue their usual exercise routine during pregnancy. Try to exercise for 30 minutes at least 5 days a week. Stop exercising if you experience uterine contractions.  Avoid heavy   lifting.  Do not exercise in extreme heat or humidity, or at high altitudes.  Wear low-heel, comfortable shoes.  Practice good posture.  You may continue to have sex unless your health care provider tells you otherwise. Relieving pain and discomfort  Take frequent breaks and rest with your legs elevated if you have leg cramps or low back pain.  Take warm sitz baths to soothe any pain or discomfort caused by hemorrhoids. Use hemorrhoid cream if your health care provider approves.  Wear a good support bra to prevent discomfort from breast tenderness.  If you develop varicose veins: ? Wear support pantyhose or compression stockings as told by your healthcare provider. ? Elevate your feet for 15 minutes, 3-4 times a day. Prenatal care  Write down your questions. Take them to your prenatal visits.  Keep all your prenatal visits as told by your health care provider. This is important. Safety  Wear your seat belt at  all times when driving.  Make a list of emergency phone numbers, including numbers for family, friends, the hospital, and police and fire departments. General instructions  Avoid cat litter boxes and soil used by cats. These carry germs that can cause birth defects in the baby. If you have a cat, ask someone to clean the litter box for you.  Do not travel far distances unless it is absolutely necessary and only with the approval of your health care provider.  Do not use hot tubs, steam rooms, or saunas.  Do not drink alcohol.  Do not use any products that contain nicotine or tobacco, such as cigarettes and e-cigarettes. If you need help quitting, ask your health care provider.  Do not use any medicinal herbs or unprescribed drugs. These chemicals affect the formation and growth of the baby.  Do not douche or use tampons or scented sanitary pads.  Do not cross your legs for long periods of time.  To prepare for the arrival of your baby: ? Take prenatal classes to understand, practice, and ask questions about labor and delivery. ? Make a trial run to the hospital. ? Visit the hospital and tour the maternity area. ? Arrange for maternity or paternity leave through employers. ? Arrange for family and friends to take care of pets while you are in the hospital. ? Purchase a rear-facing car seat and make sure you know how to install it in your car. ? Pack your hospital bag. ? Prepare the baby's nursery. Make sure to remove all pillows and stuffed animals from the baby's crib to prevent suffocation.  Visit your dentist if you have not gone during your pregnancy. Use a soft toothbrush to brush your teeth and be gentle when you floss. Contact a health care provider if:  You are unsure if you are in labor or if your water has broken.  You become dizzy.  You have mild pelvic cramps, pelvic pressure, or nagging pain in your abdominal area.  You have lower back pain.  You have persistent  nausea, vomiting, or diarrhea.  You have an unusual or bad smelling vaginal discharge.  You have pain when you urinate. Get help right away if:  Your water breaks before 37 weeks.  You have regular contractions less than 5 minutes apart before 37 weeks.  You have a fever.  You are leaking fluid from your vagina.  You have spotting or bleeding from your vagina.  You have severe abdominal pain or cramping.  You have rapid weight loss or weight gain.    You have shortness of breath with chest pain.  You notice sudden or extreme swelling of your face, hands, ankles, feet, or legs.  Your baby makes fewer than 10 movements in 2 hours.  You have severe headaches that do not go away when you take medicine.  You have vision changes. Summary  The third trimester is from week 28 through week 40, months 7 through 9. The third trimester is a time when the unborn baby (fetus) is growing rapidly.  During the third trimester, your discomfort may increase as you and your baby continue to gain weight. You may have abdominal, leg, and back pain, sleeping problems, and an increased need to urinate.  During the third trimester your breasts will keep growing and they will continue to become tender. A yellow fluid (colostrum) may leak from your breasts. This is the first milk you are producing for your baby.  False labor is a condition in which you feel small, irregular tightenings of the muscles in the womb (contractions) that eventually go away. These are called Braxton Hicks contractions. Contractions may last for hours, days, or even weeks before true labor sets in.  Signs of labor can include: abdominal cramps; regular contractions that start at 10 minutes apart and become stronger and more frequent with time; watery or bloody mucus discharge that comes from the vagina; increased pelvic pressure and dull back pain; and leaking of amniotic fluid. This information is not intended to replace advice  given to you by your health care provider. Make sure you discuss any questions you have with your health care provider. Document Released: 07/02/2001 Document Revised: 12/14/2015 Document Reviewed: 09/08/2012 [employer] Interactive Patient Education  2017 [employer]  

## 2016-11-25 NOTE — Progress Notes (Signed)
   PRENATAL VISIT NOTE  Subjective:  Maria Kelly is a 26 y.o. G1P0000 at 76108w0d being seen today for ongoing prenatal care.  She is currently monitored for the following issues for this low-risk pregnancy and has Psoriasis; Supervision of normal first pregnancy; and Scoliosis on her problem list.  Patient reports occasional contractions.  Contractions: Irritability. Vag. Bleeding: None.  Movement: Present. Denies leaking of fluid.   The following portions of the patient's history were reviewed and updated as appropriate: allergies, current medications, past family history, past medical history, past social history, past surgical history and problem list. Problem list updated.  Objective:   Vitals:   11/22/16 1110  BP: 115/72  Pulse: 69  Weight: 138 lb (62.6 kg)    Fetal Status: Fetal Heart Rate (bpm): 138   Movement: Present     General:  Alert, oriented and cooperative. Patient is in no acute distress.  Skin: Skin is warm and dry. No rash noted.   Cardiovascular: Normal heart rate noted  Respiratory: Normal respiratory effort, no problems with respiration noted  Abdomen: Soft, gravid, appropriate for gestational age. Pain/Pressure: Absent     Pelvic:  Cervical exam performed        Extremities: Normal range of motion.  Edema: None  Mental Status: Normal mood and affect. Normal behavior. Normal judgment and thought content.   Assessment and Plan:  Pregnancy: G1P0000 at 22108w0d  1. [redacted] weeks gestation of pregnancy  - Culture, beta strep (group b only) - [facility]lam[facility]  Preterm labor symptoms and general obstetric precautions including but not limited to vaginal bleeding, contractions, leaking of fluid and fetal movement were reviewed in detail with the patient. Please refer to After Visit Summary for other counseling recommendations.  RTO 1 week   Aviva SignsWilliams, Yola Paradiso L, CNM

## 2016-11-26 DIAGNOSIS — M9914 Subluxation complex (vertebral) of sacral region: Secondary | ICD-10-CM | POA: Diagnosis not present

## 2016-11-26 DIAGNOSIS — M545 Low back pain: Secondary | ICD-10-CM | POA: Diagnosis not present

## 2016-11-26 DIAGNOSIS — M9913 Subluxation complex (vertebral) of lumbar region: Secondary | ICD-10-CM | POA: Diagnosis not present

## 2016-11-26 DIAGNOSIS — M9915 Subluxation complex (vertebral) of pelvic region: Secondary | ICD-10-CM | POA: Diagnosis not present

## 2016-11-28 DIAGNOSIS — M545 Low back pain: Secondary | ICD-10-CM | POA: Diagnosis not present

## 2016-11-28 DIAGNOSIS — M9913 Subluxation complex (vertebral) of lumbar region: Secondary | ICD-10-CM | POA: Diagnosis not present

## 2016-11-28 DIAGNOSIS — M9914 Subluxation complex (vertebral) of sacral region: Secondary | ICD-10-CM | POA: Diagnosis not present

## 2016-11-28 DIAGNOSIS — M9915 Subluxation complex (vertebral) of pelvic region: Secondary | ICD-10-CM | POA: Diagnosis not present

## 2016-11-29 ENCOUNTER — Ambulatory Visit (INDEPENDENT_AMBULATORY_CARE_PROVIDER_SITE_OTHER): Payer: BLUE CROSS/BLUE SHIELD | Admitting: Obstetrics and Gynecology

## 2016-11-29 VITALS — BP 113/66 | HR 78 | Wt 139.0 lb

## 2016-11-29 DIAGNOSIS — Z3403 Encounter for supervision of normal first pregnancy, third trimester: Secondary | ICD-10-CM

## 2016-11-29 NOTE — Patient Instructions (Signed)
Breastfeeding Deciding to breastfeed is one of the best choices you can make for you and your baby. A change in hormones during pregnancy causes your breast tissue to grow and increases the number and size of your milk ducts. These hormones also allow proteins, sugars, and fats from your blood supply to make breast milk in your milk-producing glands. Hormones prevent breast milk from being released before your baby is born as well as prompt milk flow after birth. Once breastfeeding has begun, thoughts of your baby, as well as his or her sucking or crying, can stimulate the release of milk from your milk-producing glands. Benefits of breastfeeding For Your Baby  Your first milk (colostrum) helps your baby's digestive system function better.  There are antibodies in your milk that help your baby fight off infections.  Your baby has a lower incidence of asthma, allergies, and sudden infant death syndrome.  The nutrients in breast milk are better for your baby than infant formulas and are designed uniquely for your baby's needs.  Breast milk improves your baby's brain development.  Your baby is less likely to develop other conditions, such as childhood obesity, asthma, or type 2 diabetes mellitus.  For You  Breastfeeding helps to create a very special bond between you and your baby.  Breastfeeding is convenient. Breast milk is always available at the correct temperature and costs nothing.  Breastfeeding helps to burn calories and helps you lose the weight gained during pregnancy.  Breastfeeding makes your uterus contract to its prepregnancy size faster and slows bleeding (lochia) after you give birth.  Breastfeeding helps to lower your risk of developing type 2 diabetes mellitus, osteoporosis, and breast or ovarian cancer later in life.  Signs that your baby is hungry Early Signs of Hunger  Increased alertness or activity.  Stretching.  Movement of the head from side to  side.  Movement of the head and opening of the mouth when the corner of the mouth or cheek is stroked (rooting).  Increased sucking sounds, smacking lips, cooing, sighing, or squeaking.  Hand-to-mouth movements.  Increased sucking of fingers or hands.  Late Signs of Hunger  Fussing.  Intermittent crying.  Extreme Signs of Hunger Signs of extreme hunger will require calming and consoling before your baby will be able to breastfeed successfully. Do not wait for the following signs of extreme hunger to occur before you initiate breastfeeding:  Restlessness.  A loud, strong cry.  Screaming.  Breastfeeding basics Breastfeeding Initiation  Find a comfortable place to sit or lie down, with your neck and back well supported.  Place a pillow or rolled up blanket under your baby to bring him or her to the level of your breast (if you are seated). Nursing pillows are specially designed to help support your arms and your baby while you breastfeed.  Make sure that your baby's abdomen is facing your abdomen.  Gently massage your breast. With your fingertips, massage from your chest wall toward your nipple in a circular motion. This encourages milk flow. You may need to continue this action during the feeding if your milk flows slowly.  Support your breast with 4 fingers underneath and your thumb above your nipple. Make sure your fingers are well away from your nipple and your baby's mouth.  Stroke your baby's lips gently with your finger or nipple.  When your baby's mouth is open wide enough, quickly bring your baby to your breast, placing your entire nipple and as much of the colored area   areola) as possible into your baby's mouth.  More areola should be visible above your baby's upper lip than below the lower lip.  Your baby's tongue should be between his or her lower gum and your breast.  Ensure that your baby's mouth is correctly positioned around your nipple (latched). Your baby's  lips should create a seal on your breast and be turned out (everted).  It is common for your baby to suck about 2-3 minutes in order to start the flow of breast milk. Latching  Teaching your baby how to latch on to your breast properly is very important. An improper latch can cause nipple pain and decreased milk supply for you and poor weight gain in your baby. Also, if your baby is not latched onto your nipple properly, he or she may swallow some air during feeding. This can make your baby fussy. Burping your baby when you switch breasts during the feeding can help to get rid of the air. However, teaching your baby to latch on properly is still the best way to prevent fussiness from swallowing air while breastfeeding. Signs that your baby has successfully latched on to your nipple:  Silent tugging or silent sucking, without causing you pain.  Swallowing heard between every 3-4 sucks.  Muscle movement above and in front of his or her ears while sucking. Signs that your baby has not successfully latched on to nipple:  Sucking sounds or smacking sounds from your baby while breastfeeding.  Nipple pain. If you think your baby has not latched on correctly, slip your finger into the corner of your baby's mouth to break the suction and place it between your baby's gums. Attempt breastfeeding initiation again. Signs of Successful Breastfeeding  Signs from your baby:  A gradual decrease in the number of sucks or complete cessation of sucking.  Falling asleep.  Relaxation of his or her body.  Retention of a small amount of milk in his or her mouth.  Letting go of your breast by himself or herself. Signs from you:  Breasts that have increased in firmness, weight, and size 1-3 hours after feeding.  Breasts that are softer immediately after breastfeeding.  Increased milk volume, as well as a change in milk consistency and color by the fifth day of breastfeeding.  Nipples that are not sore,  cracked, or bleeding. Signs That Your Baby is Getting Enough Milk  Wetting at least 1-2 diapers during the first 24 hours after birth.  Wetting at least 5-6 diapers every 24 hours for the first week after birth. The urine should be clear or pale yellow by 5 days after birth.  Wetting 6-8 diapers every 24 hours as your baby continues to grow and develop.  At least 3 stools in a 24-hour period by age 5 days. The stool should be soft and yellow.  At least 3 stools in a 24-hour period by age 7 days. The stool should be seedy and yellow.  No loss of weight greater than 10% of birth weight during the first 3 days of age.  Average weight gain of 4-7 ounces (113-198 g) per week after age 4 days.  Consistent daily weight gain by age 5 days, without weight loss after the age of 2 weeks. After a feeding, your baby may spit up a small amount. This is common. Breastfeeding frequency and duration Frequent feeding will help you make more milk and can prevent sore nipples and breast engorgement. Breastfeed when you feel the need to reduce the   you feel the need to reduce the fullness of your breasts or when your baby shows signs of hunger. This is called "breastfeeding on demand." Avoid introducing a pacifier to your baby while you are working to establish breastfeeding (the first 4-6 weeks after your baby is born). After this time you may choose to use a pacifier. Research has shown that pacifier use during the first year of a baby's life decreases the risk of sudden infant death syndrome (SIDS). Allow your baby to feed on each breast as long as he or she wants. Breastfeed until your baby is finished feeding. When your baby unlatches or falls asleep while feeding from the first breast, offer the second breast. Because newborns are often sleepy in the first few weeks of life, you may need to awaken your baby to get him or her to feed. Breastfeeding times will vary from baby to baby. However,  the following rules can serve as a guide to help you ensure that your baby is properly fed:  Newborns (babies 4 weeks of age or younger) may breastfeed every 1-3 hours.  Newborns should not go longer than 3 hours during the day or 5 hours during the night without breastfeeding.  You should breastfeed your baby a minimum of 8 times in a 24-hour period until you begin to introduce solid foods to your baby at around 6 months of age.  Breast milk pumping Pumping and storing breast milk allows you to ensure that your baby is exclusively fed your breast milk, even at times when you are unable to breastfeed. This is especially important if you are going back to work while you are still breastfeeding or when you are not able to be present during feedings. Your lactation consultant can give you guidelines on how long it is safe to store breast milk. A breast pump is a machine that allows you to pump milk from your breast into a sterile bottle. The pumped breast milk can then be stored in a refrigerator or freezer. Some breast pumps are operated by hand, while others use electricity. Ask your lactation consultant which type will work best for you. Breast pumps can be purchased, but some hospitals and breastfeeding support groups lease breast pumps on a monthly basis. A lactation consultant can teach you how to hand express breast milk, if you prefer not to use a pump. Caring for your breasts while you breastfeed Nipples can become dry, cracked, and sore while breastfeeding. The following recommendations can help keep your breasts moisturized and healthy:  Avoid using soap on your nipples.  Wear a supportive bra. Although not required, special nursing bras and tank tops are designed to allow access to your breasts for breastfeeding without taking off your entire bra or top. Avoid wearing underwire-style bras or extremely tight bras.  Air dry your nipples for 3-4minutes after each feeding.  Use only cotton  bra pads to absorb leaked breast milk. Leaking of breast milk between feedings is normal.  Use lanolin on your nipples after breastfeeding. Lanolin helps to maintain your skin's normal moisture barrier. If you use pure lanolin, you do not need to wash it off before feeding your baby again. Pure lanolin is not toxic to your baby. You may also hand express a few drops of breast milk and gently massage that milk into your nipples and allow the milk to air dry.  In the first few weeks after giving birth, some women experience extremely full breasts (engorgement). Engorgement can make your   Engorgement peaks within 3-5 days after you give birth. The following recommendations can help ease engorgement:  Completely empty your breasts while breastfeeding or pumping. You may want to start by applying warm, moist heat (in the shower or with warm water-soaked hand towels) just before feeding or pumping. This increases circulation and helps the milk flow. If your baby does not completely empty your breasts while breastfeeding, pump any extra milk after he or she is finished.  Wear a snug bra (nursing or regular) or tank top for 1-2 days to signal your body to slightly decrease milk production.  Apply ice packs to your breasts, unless this is too uncomfortable for you.  Make sure that your baby is latched on and positioned properly while breastfeeding. If engorgement persists after 48 hours of following these recommendations, contact your health care provider or a Advertising copywriter. Overall health care recommendations while breastfeeding  Eat healthy foods. Alternate between meals and snacks, eating 3 of each per day. Because what you eat affects your breast milk, some of the foods may make your baby more irritable than usual. Avoid eating these foods if you are sure that they are negatively affecting your baby.  Drink milk, fruit juice, and water to satisfy your thirst (about 10 glasses a day).  Rest often,  relax, and continue to take your prenatal vitamins to prevent fatigue, stress, and anemia.  Continue breast self-awareness checks.  Avoid chewing and smoking tobacco. Chemicals from cigarettes that pass into breast milk and exposure to secondhand smoke may harm your baby.  Avoid alcohol and drug use, including marijuana. Some medicines that may be harmful to your baby can pass through breast milk. It is important to ask your health care provider before taking any medicine, including all over-the-counter and prescription medicine as well as vitamin and herbal supplements. It is possible to become pregnant while breastfeeding. If birth control is desired, ask your health care provider about options that will be safe for your baby. Contact a health care provider if:  You feel like you want to stop breastfeeding or have become frustrated with breastfeeding.  You have painful breasts or nipples.  Your nipples are cracked or bleeding.  Your breasts are red, tender, or warm.  You have a swollen area on either breast.  You have a fever or chills.  You have nausea or vomiting.  You have drainage other than breast milk from your nipples.  Your breasts do not become full before feedings by the fifth day after you give birth.  You feel sad and depressed.  Your baby is too sleepy to eat well.  Your baby is having trouble sleeping.  Your baby is wetting less than 3 diapers in a 24-hour period.  Your baby has less than 3 stools in a 24-hour period.  Your baby's skin or the white part of his or her eyes becomes yellow.  Your baby is not gaining weight by 31 days of age. Get help right away if:  Your baby is overly tired (lethargic) and does not want to wake up and feed.  Your baby develops an unexplained fever. This information is not intended to replace advice given to you by your health care provider. Make sure you discuss any questions you have with your health care  provider. Document Released: 07/08/2005 Document Revised: 12/20/2015 Document Reviewed: 12/30/2012 Elsevier Interactive Patient Education  2017 [employer] of the uterus can occur throughout pregnancy, but they are not always a  sign that you are in labor. You may have practice contractions called Braxton Hicks contractions. These false labor contractions are sometimes confused with true labor. What are Deberah PeltonBraxton Hicks contractions? Braxton Hicks contractions are tightening movements that occur in the muscles of the uterus before labor. Unlike true labor contractions, these contractions do not result in opening (dilation) and thinning of the cervix. Toward the end of pregnancy (32-34 weeks), Braxton Hicks contractions can happen more often and may become stronger. These contractions are sometimes difficult to tell apart from true labor because they can be very uncomfortable. You should not feel embarrassed if you go to the hospital with false labor. Sometimes, the only way to tell if you are in true labor is for your health care provider to look for changes in the cervix. The health care provider will do a physical exam and may monitor your contractions. If you are not in true labor, the exam should show that your cervix is not dilating and your water has not broken. If there are no prenatal problems or other health problems associated with your pregnancy, it is completely safe for you to be sent home with false labor. You may continue to have Braxton Hicks contractions until you go into true labor. How can I tell the difference between true labor and false labor?  Differences  False labor  Contractions last 30-70 seconds.: Contractions are usually shorter and not as strong as true labor contractions.  Contractions become very regular.: Contractions are usually irregular.  Discomfort is usually felt in the top of the uterus, and it spreads to the lower  abdomen and low back.: Contractions are often felt in the front of the lower abdomen and in the groin.  Contractions do not go away with walking.: Contractions may go away when you walk around or change positions while lying down.  Contractions usually become more intense and increase in frequency.: Contractions get weaker and are shorter-lasting as time goes on.  The cervix dilates and gets thinner.: The cervix usually does not dilate or become thin. Follow these instructions at home:  Take over-the-counter and prescription medicines only as told by your health care provider.  Keep up with your usual exercises and follow other instructions from your health care provider.  Eat and drink lightly if you think you are going into labor.  If Braxton Hicks contractions are making you uncomfortable:  Change your position from lying down or resting to walking, or change from walking to resting.  Sit and rest in a tub of warm water.  Drink enough fluid to keep your urine clear or pale yellow. Dehydration may cause these contractions.  Do slow and deep breathing several times an hour.  Keep all follow-up prenatal visits as told by your health care provider. This is important. Contact a health care provider if:  You have a fever.  You have continuous pain in your abdomen. Get help right away if:  Your contractions become stronger, more regular, and closer together.  You have fluid leaking or gushing from your vagina.  You pass blood-tinged mucus (bloody show).  You have bleeding from your vagina.  You have low back pain that you never had before.  You feel your baby's head pushing down and causing pelvic pressure.  Your baby is not moving inside you as much as it used to. Summary  Contractions that occur before labor are called Braxton Hicks contractions, false labor, or practice contractions.  Braxton Hicks contractions are usually shorter, weaker,  farther apart, and less  regular than true labor contractions. True labor contractions usually become progressively stronger and regular and they become more frequent.  Manage discomfort from [facility] contractions by changing position, resting in a warm bath, drinking plenty of water, or practicing deep breathing. This information is not intended to replace advice given to you by your health care provider. Make sure you discuss any questions you have with your health care provider. Document Released: 07/08/2005 Document Revised: 05/27/2016 Document Reviewed: 05/27/2016 Elsevier Interactive Patient Education  2017 [employer].

## 2016-11-29 NOTE — Progress Notes (Signed)
   PRENATAL VISIT NOTE  Subjective:  Maria Kelly is a 26 y.o. G1P0000 at 22100w4d being seen today for ongoing prenatal care.  She is currently monitored for the following issues for this low-risk pregnancy and has Psoriasis; Supervision of normal first pregnancy; Scoliosis; and Group B streptococcal infection during pregnancy on her problem list.  Patient reports no complaints.  Contractions: Not present. Vag. Bleeding: None.  Movement: Present. Denies leaking of fluid.   The following portions of the patient's history were reviewed and updated as appropriate: allergies, current medications, past family history, past medical history, past social history, past surgical history and problem list. Problem list updated.  Objective:   Vitals:   11/29/16 1033  BP: 113/66  Pulse: 78  Weight: 139 lb (63 kg)    Fetal Status: Fetal Heart Rate (bpm): 144   Movement: Present     General:  Alert, oriented and cooperative. Patient is in no acute distress.  Skin: Skin is warm and dry. No rash noted.   Cardiovascular: Normal heart rate noted  Respiratory: Normal respiratory effort, no problems with respiration noted  Abdomen: Soft, gravid, appropriate for gestational age. Pain/Pressure: Present     Pelvic:  Cervical exam deferred        Extremities: Normal range of motion.  Edema: None  Mental Status: Normal mood and affect. Normal behavior. Normal judgment and thought content.   Assessment and Plan:  Pregnancy: G1P0000 at 54100w4d  1. Encounter for supervision of normal first pregnancy in third trimester - Planning a waterbirth, using [facility] for doula and tub services  Term labor symptoms and general obstetric precautions including but not limited to vaginal bleeding, contractions, leaking of fluid and fetal movement were reviewed in detail with the patient. Please refer to After Visit Summary for other counseling recommendations.  Return in about 1 week (around 12/06/2016) for Return  OB - [facility].   Raelyn Moraawson, [place], CNM

## 2016-11-30 DIAGNOSIS — M9913 Subluxation complex (vertebral) of lumbar region: Secondary | ICD-10-CM | POA: Diagnosis not present

## 2016-11-30 DIAGNOSIS — M9915 Subluxation complex (vertebral) of pelvic region: Secondary | ICD-10-CM | POA: Diagnosis not present

## 2016-11-30 DIAGNOSIS — M9914 Subluxation complex (vertebral) of sacral region: Secondary | ICD-10-CM | POA: Diagnosis not present

## 2016-11-30 DIAGNOSIS — M545 Low back pain: Secondary | ICD-10-CM | POA: Diagnosis not present

## 2016-12-04 DIAGNOSIS — M9913 Subluxation complex (vertebral) of lumbar region: Secondary | ICD-10-CM | POA: Diagnosis not present

## 2016-12-04 DIAGNOSIS — M545 Low back pain: Secondary | ICD-10-CM | POA: Diagnosis not present

## 2016-12-04 DIAGNOSIS — M9915 Subluxation complex (vertebral) of pelvic region: Secondary | ICD-10-CM | POA: Diagnosis not present

## 2016-12-04 DIAGNOSIS — M9914 Subluxation complex (vertebral) of sacral region: Secondary | ICD-10-CM | POA: Diagnosis not present

## 2016-12-06 DIAGNOSIS — M9915 Subluxation complex (vertebral) of pelvic region: Secondary | ICD-10-CM | POA: Diagnosis not present

## 2016-12-06 DIAGNOSIS — M545 Low back pain: Secondary | ICD-10-CM | POA: Diagnosis not present

## 2016-12-06 DIAGNOSIS — M9914 Subluxation complex (vertebral) of sacral region: Secondary | ICD-10-CM | POA: Diagnosis not present

## 2016-12-06 DIAGNOSIS — M9913 Subluxation complex (vertebral) of lumbar region: Secondary | ICD-10-CM | POA: Diagnosis not present

## 2016-12-09 ENCOUNTER — Ambulatory Visit (INDEPENDENT_AMBULATORY_CARE_PROVIDER_SITE_OTHER): Payer: BLUE CROSS/BLUE SHIELD | Admitting: Certified Nurse Midwife

## 2016-12-09 DIAGNOSIS — Z3403 Encounter for supervision of normal first pregnancy, third trimester: Secondary | ICD-10-CM

## 2016-12-09 NOTE — Progress Notes (Signed)
Subjective:  Maria Kelly is a 26 y.o. G1P0000 at 6821w0d being seen today for ongoing prenatal care.  She is currently monitored for the following issues for this low-risk pregnancy and has Psoriasis; Supervision of normal first pregnancy; Scoliosis; and Group B streptococcal infection during pregnancy on her problem list.  Patient reports no complaints.  Contractions: Not present. Vag. Bleeding: None.  Movement: Present. Denies leaking of fluid.   The following portions of the patient's history were reviewed and updated as appropriate: allergies, current medications, past family history, past medical history, past social history, past surgical history and problem list. Problem list updated.  Objective:   Vitals:   12/09/16 1008  BP: 111/65  Pulse: 67  Weight: 140 lb (63.5 kg)    Fetal Status: Fetal Heart Rate (bpm): 143 Fundal Height: 38 cm Movement: Present  Presentation: Vertex  General:  Alert, oriented and cooperative. Patient is in no acute distress.  Skin: Skin is warm and dry. No rash noted.   Cardiovascular: Normal heart rate noted  Respiratory: Normal respiratory effort, no problems with respiration noted  Abdomen: Soft, gravid, appropriate for gestational age. Pain/Pressure: Absent     Pelvic: Vag. Bleeding: None Vag D/C Character: Thin   Cervical exam performed Dilation: 1 Effacement (%): 80 Station: -1  Extremities: Normal range of motion.  Edema: None  Mental Status: Normal mood and affect. Normal behavior. Normal judgment and thought content.   Urinalysis: Urine Protein: Trace Urine Glucose: Negative  Assessment and Plan:  Pregnancy: G1P0000 at 5321w0d  1. Encounter for supervision of normal first pregnancy in third trimester - planning waterbirth - GBS carrier-PCN in labor  Term labor symptoms and general obstetric precautions including but not limited to vaginal bleeding, contractions, leaking of fluid and fetal movement were reviewed in detail with the  patient. Please refer to After Visit Summary for other counseling recommendations.  Return in about 1 week (around 12/16/2016) for ROB with NST/AFI.   Donette LarryBhambri, Dontea Corlew, CNM

## 2016-12-11 DIAGNOSIS — M9914 Subluxation complex (vertebral) of sacral region: Secondary | ICD-10-CM | POA: Diagnosis not present

## 2016-12-11 DIAGNOSIS — M9915 Subluxation complex (vertebral) of pelvic region: Secondary | ICD-10-CM | POA: Diagnosis not present

## 2016-12-11 DIAGNOSIS — M545 Low back pain: Secondary | ICD-10-CM | POA: Diagnosis not present

## 2016-12-11 DIAGNOSIS — M9913 Subluxation complex (vertebral) of lumbar region: Secondary | ICD-10-CM | POA: Diagnosis not present

## 2016-12-13 DIAGNOSIS — M9914 Subluxation complex (vertebral) of sacral region: Secondary | ICD-10-CM | POA: Diagnosis not present

## 2016-12-13 DIAGNOSIS — M9913 Subluxation complex (vertebral) of lumbar region: Secondary | ICD-10-CM | POA: Diagnosis not present

## 2016-12-13 DIAGNOSIS — M545 Low back pain: Secondary | ICD-10-CM | POA: Diagnosis not present

## 2016-12-13 DIAGNOSIS — M9915 Subluxation complex (vertebral) of pelvic region: Secondary | ICD-10-CM | POA: Diagnosis not present

## 2016-12-17 ENCOUNTER — Ambulatory Visit (INDEPENDENT_AMBULATORY_CARE_PROVIDER_SITE_OTHER): Payer: BLUE CROSS/BLUE SHIELD | Admitting: Obstetrics & Gynecology

## 2016-12-17 VITALS — BP 112/72 | Wt 144.0 lb

## 2016-12-17 DIAGNOSIS — Z3403 Encounter for supervision of normal first pregnancy, third trimester: Secondary | ICD-10-CM

## 2016-12-17 DIAGNOSIS — O480 Post-term pregnancy: Secondary | ICD-10-CM | POA: Diagnosis not present

## 2016-12-17 NOTE — Progress Notes (Signed)
NST-Reactive    PRENATAL VISIT NOTE  Subjective:  Maria Kelly is a 26 y.o. G1P0000 at 2822w1d being seen today for ongoing prenatal care.  She is currently monitored for the following issues for this low-risk pregnancy and has Psoriasis; Supervision of normal first pregnancy; Scoliosis; and Group B streptococcal infection during pregnancy on her problem list.  Patient reports contractions since this morning--mild.  Contractions: Irregular. Vag. Bleeding: None, Bloody Show.  Movement: Present. Denies leaking of fluid.   The following portions of the patient's history were reviewed and updated as appropriate: allergies, current medications, past family history, past medical history, past social history, past surgical history and problem list. Problem list updated.  Objective:   Vitals:   12/17/16 1320  BP: 112/72  Weight: 144 lb (65.3 kg)    Fetal Status: Fetal Heart Rate (bpm): 146   Movement: Present     General:  Alert, oriented and cooperative. Patient is in no acute distress.  Skin: Skin is warm and dry. No rash noted.   Cardiovascular: Normal heart rate noted  Respiratory: Normal respiratory effort, no problems with respiration noted  Abdomen: Soft, gravid, appropriate for gestational age. Pain/Pressure: Present     Pelvic:  Cervical exam performed      1.5/70/-2  Extremities: Normal range of motion.  Edema: None  Mental Status: Normal mood and affect. Normal behavior. Normal judgment and thought content.   Assessment and Plan:  Pregnancy: G1P0000 at 2822w1d  1. Encounter for supervision of normal first pregnancy in third trimester Pt requests no membrane stripping NST reactive; needs Rpt NST and AFI on Friday GBS positive--Rx in labor Would  Like to go to 42 weeks. Fetal kick counts.  Term labor symptoms and general obstetric precautions including but not limited to vaginal bleeding, contractions, leaking of fluid and fetal movement were reviewed in detail with the  patient. Please refer to After Visit Summary for other counseling recommendations.  Return in about 3 days (around 12/20/2016).   Elsie LincolnKelly Oneill Bais, MD

## 2016-12-17 NOTE — Patient Instructions (Signed)
Fetal Movement Counts  Patient Name: ________________________________________________ Patient Due Date: ____________________  What is a fetal movement count?  A fetal movement count is the number of times that you feel your baby move during a certain amount of time. This may also be called a fetal kick count. A fetal movement count is recommended for every pregnant woman. You may be asked to start counting fetal movements as early as week 28 of your pregnancy.  Pay attention to when your baby is most active. You may notice your baby's sleep and wake cycles. You may also notice things that make your baby move more. You should do a fetal movement count:  · When your baby is normally most active.  · At the same time each day.    A good time to count movements is while you are resting, after having something to eat and drink.  How do I count fetal movements?  1. Find a quiet, comfortable area. Sit, or lie down on your side.  2. Write down the date, the start time and stop time, and the number of movements that you felt between those two times. Take this information with you to your health care visits.  3. For 2 hours, count kicks, flutters, swishes, rolls, and jabs. You should feel at least 10 movements during 2 hours.  4. You may stop counting after you have felt 10 movements.  5. If you do not feel 10 movements in 2 hours, have something to eat and drink. Then, keep resting and counting for 1 hour. If you feel at least 4 movements during that hour, you may stop counting.  Contact a health care provider if:  · You feel fewer than 4 movements in 2 hours.  · Your baby is not moving like he or she usually does.  Date: ____________ Start time: ____________ Stop time: ____________ Movements: ____________  Date: ____________ Start time: ____________ Stop time: ____________ Movements: ____________  Date: ____________ Start time: ____________ Stop time: ____________ Movements: ____________  Date: ____________ Start time:  ____________ Stop time: ____________ Movements: ____________  Date: ____________ Start time: ____________ Stop time: ____________ Movements: ____________  Date: ____________ Start time: ____________ Stop time: ____________ Movements: ____________  Date: ____________ Start time: ____________ Stop time: ____________ Movements: ____________  Date: ____________ Start time: ____________ Stop time: ____________ Movements: ____________  Date: ____________ Start time: ____________ Stop time: ____________ Movements: ____________  This information is not intended to replace advice given to you by your health care provider. Make sure you discuss any questions you have with your health care provider.  Document Released: 08/07/2006 Document Revised: 03/06/2016 Document Reviewed: 08/17/2015  Elsevier Interactive Patient Education © 2017 Else[employer] Inc[employer]

## 2016-12-18 DIAGNOSIS — M9913 Subluxation complex (vertebral) of lumbar region: Secondary | ICD-10-CM | POA: Diagnosis not present

## 2016-12-18 DIAGNOSIS — M9915 Subluxation complex (vertebral) of pelvic region: Secondary | ICD-10-CM | POA: Diagnosis not present

## 2016-12-18 DIAGNOSIS — M545 Low back pain: Secondary | ICD-10-CM | POA: Diagnosis not present

## 2016-12-18 DIAGNOSIS — M9914 Subluxation complex (vertebral) of sacral region: Secondary | ICD-10-CM | POA: Diagnosis not present

## 2016-12-19 ENCOUNTER — Inpatient Hospital Stay (HOSPITAL_COMMUNITY)
Admission: AD | Admit: 2016-12-19 | Discharge: 2016-12-21 | DRG: 775 | Disposition: A | Discharge status: Home / Self Care | Payer: BLUE CROSS/BLUE SHIELD | Source: Ambulatory Visit | Attending: Family Medicine | Admitting: Family Medicine

## 2016-12-19 ENCOUNTER — Encounter (HOSPITAL_COMMUNITY): Payer: Self-pay | Admitting: Obstetrics

## 2016-12-19 DIAGNOSIS — Z3403 Encounter for supervision of normal first pregnancy, third trimester: Secondary | ICD-10-CM

## 2016-12-19 DIAGNOSIS — M419 Scoliosis, unspecified: Secondary | ICD-10-CM | POA: Diagnosis not present

## 2016-12-19 DIAGNOSIS — Z3A40 40 weeks gestation of pregnancy: Secondary | ICD-10-CM | POA: Diagnosis not present

## 2016-12-19 DIAGNOSIS — Z3493 Encounter for supervision of normal pregnancy, unspecified, third trimester: Secondary | ICD-10-CM | POA: Diagnosis not present

## 2016-12-19 DIAGNOSIS — O99824 Streptococcus B carrier state complicating childbirth: Principal | ICD-10-CM | POA: Diagnosis present

## 2016-12-19 LAB — CBC
HEMATOCRIT: 39.5 % (ref 36.0–46.0)
HEMOGLOBIN: 13.8 g/dL (ref 12.0–15.0)
MCH: 30.5 pg (ref 26.0–34.0)
MCHC: 34.9 g/dL (ref 30.0–36.0)
MCV: 87.2 fL (ref 78.0–100.0)
Platelets: 164 10*3/uL (ref 150–400)
RBC: 4.53 MIL/uL (ref 3.87–5.11)
RDW: 13.1 % (ref 11.5–15.5)
WBC: 13.5 10*3/uL — ABNORMAL HIGH (ref 4.0–10.5)

## 2016-12-19 LAB — TYPE AND SCREEN
ABO/RH(D): A POS
ANTIBODY SCREEN: NEGATIVE

## 2016-12-19 LAB — ABO/RH: ABO/RH(D): A POS

## 2016-12-19 LAB — OB RESULTS CONSOLE GBS: GBS: POSITIVE

## 2016-12-19 MED ORDER — IBUPROFEN 600 MG PO TABS
600.0000 mg | ORAL_TABLET | Freq: Four times a day (QID) | ORAL | Status: DC
  Administered 2016-12-19 – 2016-12-21 (×7): 600 mg via ORAL
  Filled 2016-12-19 (×7): qty 1

## 2016-12-19 MED ORDER — SOD CITRATE-CITRIC ACID 500-334 MG/5ML PO SOLN: 30.0000 mL | ORAL | Status: DC | PRN

## 2016-12-19 MED ORDER — ZOLPIDEM TARTRATE 5 MG PO TABS: 5.0000 mg | ORAL_TABLET | Freq: Every evening | ORAL | Status: DC | PRN

## 2016-12-19 MED ORDER — DIBUCAINE 1 % RE OINT: 1.0000 "application " | TOPICAL_OINTMENT | RECTAL | Status: DC | PRN

## 2016-12-19 MED ORDER — ACETAMINOPHEN 325 MG PO TABS: 650.0000 mg | ORAL_TABLET | ORAL | Status: DC | PRN

## 2016-12-19 MED ORDER — ONDANSETRON HCL 4 MG/2ML IJ SOLN: 4.0000 mg | INTRAMUSCULAR | Status: DC | PRN

## 2016-12-19 MED ORDER — METHYLERGONOVINE MALEATE 0.2 MG/ML IJ SOLN: 0.2000 mg | INTRAMUSCULAR | Status: DC | PRN

## 2016-12-19 MED ORDER — PRENATAL MULTIVITAMIN CH
1.0000 | ORAL_TABLET | Freq: Every day | ORAL | Status: DC
  Administered 2016-12-20 – 2016-12-21 (×2): 1 via ORAL
  Filled 2016-12-19 (×2): qty 1

## 2016-12-19 MED ORDER — FENTANYL CITRATE (PF) 100 MCG/2ML IJ SOLN: 100.0000 ug | INTRAMUSCULAR | Status: DC | PRN

## 2016-12-19 MED ORDER — BISACODYL 10 MG RE SUPP: 10.0000 mg | Freq: Every day | RECTAL | Status: DC | PRN

## 2016-12-19 MED ORDER — LACTATED RINGERS IV SOLN: INTRAVENOUS | Status: DC

## 2016-12-19 MED ORDER — MEASLES, MUMPS & RUBELLA VAC ~~LOC~~ INJ: 0.5000 mL | INJECTION | Freq: Once | SUBCUTANEOUS | Status: DC

## 2016-12-19 MED ORDER — TETANUS-DIPHTH-ACELL PERTUSSIS 5-2.5-18.5 LF-MCG/0.5 IM SUSP: 0.5000 mL | Freq: Once | INTRAMUSCULAR | Status: DC

## 2016-12-19 MED ORDER — SIMETHICONE 80 MG PO CHEW: 80.0000 mg | CHEWABLE_TABLET | ORAL | Status: DC | PRN

## 2016-12-19 MED ORDER — ONDANSETRON HCL 4 MG/2ML IJ SOLN: 4.0000 mg | Freq: Four times a day (QID) | INTRAMUSCULAR | Status: DC | PRN

## 2016-12-19 MED ORDER — LIDOCAINE HCL (PF) 1 % IJ SOLN
30.0000 mL | INTRAMUSCULAR | Status: DC | PRN
  Administered 2016-12-19: 30 mL via SUBCUTANEOUS
  Filled 2016-12-19: qty 30

## 2016-12-19 MED ORDER — DOCUSATE SODIUM 100 MG PO CAPS
100.0000 mg | ORAL_CAPSULE | Freq: Two times a day (BID) | ORAL | Status: DC
  Administered 2016-12-19 – 2016-12-21 (×4): 100 mg via ORAL
  Filled 2016-12-19 (×4): qty 1

## 2016-12-19 MED ORDER — FLEET ENEMA 7-19 GM/118ML RE ENEM: 1.0000 | ENEMA | Freq: Every day | RECTAL | Status: DC | PRN

## 2016-12-19 MED ORDER — AMPICILLIN SODIUM 2 G IJ SOLR
2.0000 g | Freq: Once | INTRAMUSCULAR | Status: AC
  Administered 2016-12-19: 2 g via INTRAVENOUS
  Filled 2016-12-19: qty 2000

## 2016-12-19 MED ORDER — OXYCODONE HCL 5 MG PO TABS: 10.0000 mg | ORAL_TABLET | ORAL | Status: DC | PRN

## 2016-12-19 MED ORDER — OXYCODONE HCL 5 MG PO TABS: 5.0000 mg | ORAL_TABLET | ORAL | Status: DC | PRN

## 2016-12-19 MED ORDER — COCONUT OIL OIL: 1.0000 "application " | TOPICAL_OIL | Status: DC | PRN

## 2016-12-19 MED ORDER — OXYTOCIN 40 UNITS IN LACTATED RINGERS INFUSION - SIMPLE MED
2.5000 [IU]/h | INTRAVENOUS | Status: DC
  Filled 2016-12-19: qty 1000

## 2016-12-19 MED ORDER — BENZOCAINE-MENTHOL 20-0.5 % EX AERO
1.0000 "application " | INHALATION_SPRAY | CUTANEOUS | Status: DC | PRN
  Filled 2016-12-19: qty 56

## 2016-12-19 MED ORDER — LACTATED RINGERS IV SOLN: 500.0000 mL | INTRAVENOUS | Status: DC | PRN

## 2016-12-19 MED ORDER — OXYTOCIN 10 UNIT/ML IJ SOLN
INTRAMUSCULAR | Status: AC
  Filled 2016-12-19: qty 1

## 2016-12-19 MED ORDER — METHYLERGONOVINE MALEATE 0.2 MG PO TABS: 0.2000 mg | ORAL_TABLET | ORAL | Status: DC | PRN

## 2016-12-19 MED ORDER — WITCH HAZEL-GLYCERIN EX PADS: 1.0000 "application " | MEDICATED_PAD | CUTANEOUS | Status: DC | PRN

## 2016-12-19 MED ORDER — DIPHENHYDRAMINE HCL 25 MG PO CAPS: 25.0000 mg | ORAL_CAPSULE | Freq: Four times a day (QID) | ORAL | Status: DC | PRN

## 2016-12-19 MED ORDER — ONDANSETRON HCL 4 MG PO TABS: 4.0000 mg | ORAL_TABLET | ORAL | Status: DC | PRN

## 2016-12-19 MED ORDER — OXYCODONE-ACETAMINOPHEN 5-325 MG PO TABS: 2.0000 | ORAL_TABLET | ORAL | Status: DC | PRN

## 2016-12-19 MED ORDER — OXYCODONE-ACETAMINOPHEN 5-325 MG PO TABS: 1.0000 | ORAL_TABLET | ORAL | Status: DC | PRN

## 2016-12-19 MED ORDER — FERROUS SULFATE 325 (65 FE) MG PO TABS
325.0000 mg | ORAL_TABLET | Freq: Two times a day (BID) | ORAL | Status: DC
  Administered 2016-12-20 – 2016-12-21 (×3): 325 mg via ORAL
  Filled 2016-12-19 (×3): qty 1

## 2016-12-19 MED ORDER — OXYTOCIN BOLUS FROM INFUSION
500.0000 mL | Freq: Once | INTRAVENOUS | Status: AC
  Administered 2016-12-19: 500 mL via INTRAVENOUS

## 2016-12-19 NOTE — H&P (Signed)
LABOR AND DELIVERY ADMISSION HISTORY AND PHYSICAL NOTE  Maria Kelly is a 26 y.o. female G1P0000 with IUP at 6363w3d by LMP and 7 wk US presenting for SOL and SROM.   She reports positive fetal movement. She denies leakage of fluid or vaginal bleeding.  Prenatal History/Complications:  Past Medical History: Past Medical History:  Diagnosis Date  . Psoriasis   . Psoriatic arthritis (HCC)   . Scoliosis     Past Surgical History: Past Surgical History:  Procedure Laterality Date  . NASAL SEPTUM SURGERY    . NASAL SINUS SURGERY      Obstetrical History: OB History    Gravida Para Term Preterm AB Living   1 0 0 0 0 0   SAB TAB Ectopic Multiple Live Births   0 0 0 0        Social History: Social History   Social History  . Marital status: Married    Spouse name: N/A  . Number of children: N/A  . Years of education: N/A   Occupational History  . [employer]     Social History Main Topics  . Smoking status: Never Smoker  . Smokeless tobacco: Never Used  . Alcohol use No     Comment: occassional  . Drug use: No  . Sexual activity: Yes    Partners: Male    Birth control/ protection: None   Other Topics Concern  . None   Social History Narrative  . None    Family History: Family History  Problem Relation Age of Onset  . Heart attack Paternal Grandfather   . Cancer Paternal Grandfather        brain  . Cancer Maternal Grandfather     Allergies: Allergies  Allergen Reactions  . Keflex [Cephalexin] Hives    Prescriptions Prior to Admission  Medication Sig Dispense Refill Last Dose  . Doxylamine-Pyridoxine 10-10 MG TBEC Take two tablets at night and one tablet in the morning. 90 tablet 3 Taking  . ondansetron (ZOFRAN) 8 MG tablet Take 1 tablet (8 mg total) by mouth every 8 (eight) hours as needed for nausea or vomiting. 60 tablet 0 Taking  . Prenatal Vit-Fe Fumarate-FA (MULTIVITAMIN-PRENATAL) 27-0.8 MG TABS tablet Take 1 tablet by mouth daily at 12 noon.    Taking     Review of Systems   All systems reviewed and negative except as stated in HPI  Blood pressure 104/63, pulse 70, temperature 98.3 F (36.8 C), temperature source Oral, resp. rate 18, height 5\' 2"  (1.575 m), weight 144 lb (65.3 kg), last menstrual period 03/11/2016. General appearance: alert, cooperative and appears stated age Lungs: No respiratory distress Extremities: No calf swelling or tenderness Presentation: cephalic by nursing exam Fetal monitoring: category 1 Uterine activity: contrations every 2 minutes Dilation: 7 Effacement (%): 100 Exam by:: [email or web address],Rn   Prenatal labs: ABO, Rh: A/POS/-- (10/09 1156) Antibody: NEG (10/09 1156) Rubella: immune RPR: NON REAC (02/28 0809)  HBsAg: NEGATIVE (10/09 1156)  HIV: NONREACTIVE (02/28 0809)  GBS: Positive (05/31 0000)  2 hr Glucola: normal Genetic screening:  First trimester screen normal Anatomy US: normal  Prenatal Transfer Tool  Maternal Diabetes: No Genetic Screening: Normal Maternal Ultrasounds/Referrals: Normal Fetal Ultrasounds or other Referrals:  None Maternal Substance Abuse:  No Significant Maternal Medications:  None Significant Maternal Lab Results: Lab values include: Group B Strep positive  Results for orders placed or performed during the hospital encounter of 12/19/16 (from the past 24 hour(s))  OB RESULT CONSOLE Group B Strep  Collection Time: 12/19/16 12:00 AM  Result Value Ref Range   GBS Positive     Patient Active Problem List   Diagnosis Date Noted  . Group B streptococcal infection during pregnancy 11/25/2016  . Scoliosis 05/27/2016  . Supervision of normal first pregnancy 04/29/2016  . Psoriasis 02/05/2016    Assessment: Maria Kelly is a 26 y.o. G1P0000 at [redacted]w[redacted]d here for  SOL  #Labor:expectant management plans for waterbirth #Pain: waterbirth #FWB: Category 1 #ID:  GBS pos- amp #MOF: breast #MOC:undecided #Circ:  Yes inpatient.  Ernestina Penna 12/19/2016, 5:35  PM

## 2016-12-20 ENCOUNTER — Other Ambulatory Visit: Payer: BLUE CROSS/BLUE SHIELD

## 2016-12-20 LAB — RPR: RPR Ser Ql: NONREACTIVE

## 2016-12-20 NOTE — Lactation Note (Signed)
This note was copied from a baby's chart. Lactation Consultation Note  Patient Name: Maria Kelly ZOXWR'UToday's Date: 12/20/2016 Reason for consult: Follow-up assessment Baby at 18 hr of life. Upon entry baby had a shallow latch in football position and mom was reporting nipple pinching. When baby came off the breast, there was a pronounced compression stripe with a clear pea sized blister in the center of the nipple. Showed parents how to adjust baby and get a deeper latch. Mom reports the deeper latch felt better. Given comfort gels. Discussed baby behavior, feeding frequency, baby belly size, voids, wt loss, breast changes, and nipple care. Mom can manually express and spoon feed. Parents are aware of lactation services and support group.    Maternal Data    Feeding Feeding Type: Breast Fed Length of feed: 20 min  LATCH Score/Interventions Latch: Grasps breast easily, tongue down, lips flanged, rhythmical sucking. Intervention(s): Assist with latch;Adjust position  Audible Swallowing: A few with stimulation Intervention(s): Skin to skin;Hand expression  Type of Nipple: Everted at rest and after stimulation  Comfort (Breast/Nipple): Filling, red/small blisters or bruises, mild/mod discomfort  Problem noted: Cracked, bleeding, blisters, bruises;Mild/Moderate discomfort Interventions  (Cracked/bleeding/bruising/blister): Expressed breast milk to nipple Interventions (Mild/moderate discomfort): Comfort gels  Hold (Positioning): No assistance needed to correctly position infant at breast.  LATCH Score: 8  Lactation Tools Discussed/Used     Consult Status Consult Status: Follow-up Date: 12/21/16 Follow-up type: In-patient    Maria Kelly 12/20/2016, 2:02 PM

## 2016-12-20 NOTE — Progress Notes (Signed)
Post Partum Day 1  Subjective:  Maria Kelly is a 26 y.o. G1P1001 8753w3d s/p NSVD.  No acute events overnight.  Pt denies problems with ambulating, voiding or po intake.  She denies nausea or vomiting.  Pain is well controlled.  She has had flatus. She has not had bowel movement.  Lochia Moderate.  Plan for birth control is undecided, will discuss at 6 week appointment.  Method of Feeding: breast.  Objective: BP 124/79   Pulse 78   Temp 98.6 F (37 C) (Oral)   Resp 18   Ht 5\' 2"  (1.575 m)   Wt 65.3 kg (144 lb)   LMP 03/11/2016 (Exact Date)   Breastfeeding? Unknown   BMI 26.34 kg/m   Physical Exam:  General: alert, cooperative and no distress Lochia:normal flow Chest: CTAB Heart: RRR no m/r/g; normal S1, S2 Abdomen: +BS, soft, nontender, fundus firm at umbilicus DVT Evaluation: No evidence of DVT seen on physical exam. Extremities: no edema   Recent Labs  12/19/16 1715  HGB 13.8  HCT 39.5    Assessment/Plan:  ASSESSMENT: Maria Askewlisha Kassabian is a 26 y.o. G1P1001 4753w3d ppd #1 s/p NSVD, doing well. Continue routine care.   Plan for discharge tomorrow   LOS: 1 day   Thurnell Loselessandra G Tomasi 12/20/2016, 7:55 AM

## 2016-12-20 NOTE — Lactation Note (Addendum)
This note was copied from a baby's chart. Lactation Consultation Note New mom states BF going well. Had several good feeding. Mom has round breast w/everted nipples. Very compressible. Assisted in football hold, assisted in obtaining deep latch. Mom had baby swaddled, encouraged STS.  Hand expression easy flow colostrum. Discuss spoon feeding. Mom had all ready spoon fed. Mom encouraged to feed baby 8-12 times/24 hours and with feeding cues. Mom encouraged to waken baby for feeds.  Educated on newborn feeding behavior, I&O, cluster feeding, supply and demand. Mom went to BF classes. Requested hand pump. Taught set up, pumping, and cleaning. WH/LC brochure given w/resources, support groups and LC services.  Patient Name: Maria Jannet Askewlisha Wierzbicki RUEAV'WToday's Date: 12/20/2016 Reason for consult: Initial assessment   Maternal Data Has patient been taught Hand Expression?: Yes Does the patient have breastfeeding experience prior to this delivery?: No  Feeding Feeding Type: Breast Fed Length of feed: 10 min  LATCH Score/Interventions Latch: Grasps breast easily, tongue down, lips flanged, rhythmical sucking. Intervention(s): Adjust position;Assist with latch;Breast massage;Breast compression  Audible Swallowing: Spontaneous and intermittent Intervention(s): Hand expression;Alternate breast massage  Type of Nipple: Everted at rest and after stimulation  Comfort (Breast/Nipple): Soft / non-tender     Hold (Positioning): Assistance needed to correctly position infant at breast and maintain latch. Intervention(s): Breastfeeding basics reviewed;Support Pillows;Position options;Skin to skin  LATCH Score: 9  Lactation Tools Discussed/Used Tools: Pump Breast pump type: Manual Pump Review: Setup, frequency, and cleaning;Milk Storage Initiated by:: Peri JeffersonL. Omaira Mellen RN IBCLC Date initiated:: 12/20/16   Consult Status Consult Status: Follow-up Date: 12/21/16 Follow-up type: In-patient    Maria DancerCARVER,  Maria Kelly 12/20/2016, 5:45 AM

## 2016-12-21 MED ORDER — SENNOSIDES-DOCUSATE SODIUM 8.6-50 MG PO TABS: 2.0000 | ORAL_TABLET | Freq: Every evening | ORAL | 1 refills | Status: AC | PRN

## 2016-12-21 MED ORDER — MEASLES, MUMPS & RUBELLA VAC ~~LOC~~ INJ: 0.5000 mL | INJECTION | Freq: Once | SUBCUTANEOUS | 0 refills | Status: AC

## 2016-12-21 MED ORDER — IBUPROFEN 600 MG PO TABS: 600.0000 mg | ORAL_TABLET | Freq: Four times a day (QID) | ORAL | 0 refills | Status: DC

## 2016-12-21 NOTE — Discharge Summary (Signed)
OB Discharge Summary     Patient Name: Maria Kelly DOB: 09/24/1990 MRN: [id number]  Date of admission: 12/19/2016 Delivering MD: Laury Deep   Date of discharge: 12/21/2016  Admitting diagnosis: [redacted]w[redacted]d CTX, [place]  Intrauterine pregnancy: 430w3d   Secondary diagnosis:  Active Problems:   Indication for care in labor or delivery  Additional problems:  Patient Active Problem List   Diagnosis Date Noted  . Indication for care in labor or delivery 12/19/2016  . Group B streptococcal infection during pregnancy 11/25/2016  . Scoliosis 05/27/2016  . Supervision of normal first pregnancy 04/29/2016  . Psoriasis 02/05/2016        Discharge diagnosis: Term Pregnancy Delivered                                                                                                Post partum procedures:none  Augmentation: none  Complications: None  Hospital course:  Onset of Labor With Vaginal Delivery     [age].o. yo G1P1001 at 4065w3ds admitted in Active Labor on 12/19/2016. Patient had an uncomplicated labor course as follows:  Membrane Rupture Time/Date: 3:56 PM ,12/19/2016   Intrapartum Procedures: Episiotomy:                                           Lacerations:  1st degree [2];Vaginal [6]  Patient had a delivery of a Viable infant. 12/19/2016  Information for the patient's newborn:  CusKyrsten, Deleeuw3[[id number]]    Pateint had an uncomplicated postpartum course.  She is ambulating, tolerating a regular diet, passing flatus, and urinating well. Patient is discharged home in stable condition on 12/21/16.   Physical exam  Vitals:   12/19/16 2257 12/20/16 0300 12/20/16 1900 12/21/16 0606  BP: 122/77 124/79 (!) 108/58 115/64  Pulse: 89 78 69 (!) 59  Resp: '18 18 20 20  '$ Temp: 98.4 F (36.9 C) 98.6 F (37 C) 98.5 F (36.9 C) 98 F (36.7 C)  TempSrc: Oral Oral Oral Oral  Weight:      Height:       General: alert, cooperative and no distress Lochia:  appropriate Uterine Fundus: firm, below umbilicus DVT Evaluation: No evidence of DVT seen on physical exam. Labs: Lab Results  Component Value Date   WBC 13.5 (H) 12/19/2016   HGB 13.8 12/19/2016   HCT 39.5 12/19/2016   MCV 87.2 12/19/2016   PLT 164 12/19/2016   No flowsheet data found.  Discharge instruction: per After Visit Summary and "Baby and Me Booklet".  After visit meds:  Allergies as of 12/21/2016      Reactions   Keflex [cephalexin] Hives      Medication List    STOP taking these medications   Doxylamine-Pyridoxine 10-10 MG Tbec   ondansetron 8 MG tablet Commonly known as:  ZOFRAN     TAKE these medications   ibuprofen 600 MG tablet Commonly known as:  ADVIL,MOTRIN Take 1 tablet (600 mg total) by  mouth every 6 (six) hours.   measles, mumps and rubella vaccine injection Commonly known as:  MMR Inject 0.5 mLs into the skin once.   multivitamin-prenatal 27-0.8 MG Tabs tablet Take 1 tablet by mouth daily at 12 noon.   senna-docusate 8.6-50 MG tablet Commonly known as:  Senokot-S Take 2 tablets by mouth at bedtime as needed for mild constipation.       Diet: routine diet  Activity: Advance as tolerated. Pelvic rest for 6 weeks.   Outpatient follow up:6 weeks Follow up Appt:No future appointments. Follow up Visit:No Follow-up on file.  Postpartum contraception: Undecided, considering pills vs LARC  Newborn Data: Live born female  Birth Weight: 7 lb 4.1 oz (3291 g) APGAR: 8, 9  Baby Feeding: Breast Disposition:home with mother   12/21/2016 Everrett Coombe, MD

## 2016-12-21 NOTE — Discharge Instructions (Signed)

## 2016-12-21 NOTE — Lactation Note (Signed)
This note was copied from a baby's chart. Lactation Consultation Note  Patient Name: Maria Kelly ZOXWR'UToday's Date: 12/21/2016 Reason for consult: Follow-up assessment Mom reports baby is nursing well. She had small blister on right nipple. Using comfort gels and reports blister improving. Advised baby should be at breast 8-12 times or more in 24 hours, nursing 15-30 minutes both breasts some feedings. Engorgement care reviewed if needed. Advised of OP services and support group. Encouraged to call for assist before d/c if would like help with latch.   Maternal Data    Feeding Feeding Type: Breast Fed Length of feed: 15 min  LATCH Score/Interventions                      Lactation Tools Discussed/Used Tools: Comfort gels;Pump Breast pump type: Manual   Consult Status Consult Status: Complete Date: 12/21/16 Follow-up type: In-patient    Alfred LevinsGranger, Pietra Zuluaga Ann 12/21/2016, 10:22 AM

## 2016-12-24 ENCOUNTER — Encounter: Payer: BLUE CROSS/BLUE SHIELD | Admitting: Obstetrics & Gynecology

## 2016-12-24 DIAGNOSIS — M9915 Subluxation complex (vertebral) of pelvic region: Secondary | ICD-10-CM | POA: Diagnosis not present

## 2016-12-24 DIAGNOSIS — M9913 Subluxation complex (vertebral) of lumbar region: Secondary | ICD-10-CM | POA: Diagnosis not present

## 2016-12-24 DIAGNOSIS — M9914 Subluxation complex (vertebral) of sacral region: Secondary | ICD-10-CM | POA: Diagnosis not present

## 2016-12-24 DIAGNOSIS — M545 Low back pain: Secondary | ICD-10-CM | POA: Diagnosis not present

## 2016-12-26 DIAGNOSIS — M9913 Subluxation complex (vertebral) of lumbar region: Secondary | ICD-10-CM | POA: Diagnosis not present

## 2016-12-26 DIAGNOSIS — M9915 Subluxation complex (vertebral) of pelvic region: Secondary | ICD-10-CM | POA: Diagnosis not present

## 2016-12-26 DIAGNOSIS — M9914 Subluxation complex (vertebral) of sacral region: Secondary | ICD-10-CM | POA: Diagnosis not present

## 2016-12-26 DIAGNOSIS — M545 Low back pain: Secondary | ICD-10-CM | POA: Diagnosis not present

## 2017-01-01 DIAGNOSIS — M9914 Subluxation complex (vertebral) of sacral region: Secondary | ICD-10-CM | POA: Diagnosis not present

## 2017-01-01 DIAGNOSIS — M545 Low back pain: Secondary | ICD-10-CM | POA: Diagnosis not present

## 2017-01-01 DIAGNOSIS — M9915 Subluxation complex (vertebral) of pelvic region: Secondary | ICD-10-CM | POA: Diagnosis not present

## 2017-01-01 DIAGNOSIS — M9913 Subluxation complex (vertebral) of lumbar region: Secondary | ICD-10-CM | POA: Diagnosis not present

## 2017-01-02 DIAGNOSIS — M9915 Subluxation complex (vertebral) of pelvic region: Secondary | ICD-10-CM | POA: Diagnosis not present

## 2017-01-02 DIAGNOSIS — M9913 Subluxation complex (vertebral) of lumbar region: Secondary | ICD-10-CM | POA: Diagnosis not present

## 2017-01-02 DIAGNOSIS — M545 Low back pain: Secondary | ICD-10-CM | POA: Diagnosis not present

## 2017-01-02 DIAGNOSIS — M9914 Subluxation complex (vertebral) of sacral region: Secondary | ICD-10-CM | POA: Diagnosis not present

## 2017-01-05 DIAGNOSIS — O9123 Nonpurulent mastitis associated with lactation: Secondary | ICD-10-CM | POA: Diagnosis not present

## 2017-01-05 DIAGNOSIS — N644 Mastodynia: Secondary | ICD-10-CM | POA: Diagnosis not present

## 2017-01-05 DIAGNOSIS — R21 Rash and other nonspecific skin eruption: Secondary | ICD-10-CM | POA: Diagnosis not present

## 2017-01-05 DIAGNOSIS — R509 Fever, unspecified: Secondary | ICD-10-CM | POA: Diagnosis not present

## 2017-01-05 DIAGNOSIS — Z881 Allergy status to other antibiotic agents status: Secondary | ICD-10-CM | POA: Diagnosis not present

## 2017-01-07 DIAGNOSIS — M9915 Subluxation complex (vertebral) of pelvic region: Secondary | ICD-10-CM | POA: Diagnosis not present

## 2017-01-07 DIAGNOSIS — M9914 Subluxation complex (vertebral) of sacral region: Secondary | ICD-10-CM | POA: Diagnosis not present

## 2017-01-07 DIAGNOSIS — M9913 Subluxation complex (vertebral) of lumbar region: Secondary | ICD-10-CM | POA: Diagnosis not present

## 2017-01-07 DIAGNOSIS — M545 Low back pain: Secondary | ICD-10-CM | POA: Diagnosis not present

## 2017-01-09 DIAGNOSIS — M545 Low back pain: Secondary | ICD-10-CM | POA: Diagnosis not present

## 2017-01-09 DIAGNOSIS — M9914 Subluxation complex (vertebral) of sacral region: Secondary | ICD-10-CM | POA: Diagnosis not present

## 2017-01-09 DIAGNOSIS — M9915 Subluxation complex (vertebral) of pelvic region: Secondary | ICD-10-CM | POA: Diagnosis not present

## 2017-01-09 DIAGNOSIS — M9913 Subluxation complex (vertebral) of lumbar region: Secondary | ICD-10-CM | POA: Diagnosis not present

## 2017-01-17 ENCOUNTER — Encounter: Payer: Self-pay | Admitting: Advanced Practice Midwife

## 2017-01-17 ENCOUNTER — Ambulatory Visit (INDEPENDENT_AMBULATORY_CARE_PROVIDER_SITE_OTHER): Payer: BLUE CROSS/BLUE SHIELD | Admitting: Advanced Practice Midwife

## 2017-01-17 VITALS — BP 107/66 | HR 76 | Ht 65.0 in | Wt 124.0 lb

## 2017-01-17 DIAGNOSIS — Z30011 Encounter for initial prescription of contraceptive pills: Secondary | ICD-10-CM

## 2017-01-17 DIAGNOSIS — M545 Low back pain: Secondary | ICD-10-CM | POA: Diagnosis not present

## 2017-01-17 DIAGNOSIS — M9913 Subluxation complex (vertebral) of lumbar region: Secondary | ICD-10-CM | POA: Diagnosis not present

## 2017-01-17 DIAGNOSIS — M9914 Subluxation complex (vertebral) of sacral region: Secondary | ICD-10-CM | POA: Diagnosis not present

## 2017-01-17 DIAGNOSIS — M9915 Subluxation complex (vertebral) of pelvic region: Secondary | ICD-10-CM | POA: Diagnosis not present

## 2017-01-17 MED ORDER — NORETHINDRONE 0.35 MG PO TABS: 1.0000 | ORAL_TABLET | Freq: Every day | ORAL | 12 refills | Status: DC

## 2017-01-17 NOTE — Patient Instructions (Signed)
Breast Pumping Tips °If you are breastfeeding, there may be times when you cannot feed your baby directly. Returning to work or going on a trip are common examples. Pumping allows you to store breast milk and feed it to your baby later. °You may not get much milk when you first start to pump. Your breasts should start to make more after a few days. If you pump at the times you usually feed your baby, you may be able to keep making enough milk to feed your baby without also using formula. The more often you pump, the more milk you will produce. °When should I pump? °· You can begin to pump soon after delivery. However, some experts recommend waiting about 4 weeks before giving your infant a bottle to make sure breastfeeding is going well. °· If you plan to return to work, begin pumping a few weeks before. This will help you develop techniques that work best for you. It also lets you build up a supply of breast milk. °· When you are with your infant, feed on demand and pump after each feeding. °· When you are away from your infant for several hours, pump for about 15 minutes every 2-3 hours. Pump both breasts at the same time if you can. °· If your infant has a formula feeding, make sure to pump around the same time. °· If you drink any alcohol, wait 2 hours before pumping. °How do I prepare to pump? °Your let-down reflex is the natural reaction to stimulation that makes your breast milk flow. It is easier to stimulate this reflex when you are relaxed. Find relaxation techniques that work for you. If you have difficulty with your let-down reflex, try these methods: °· Smell one of your infant's blankets or an item of clothing. °· Look at a picture or video of your infant. °· Sit in a quiet, private space. °· Massage the breast you plan to pump. °· Place soothing warmth on the breast. °· Play relaxing music. ° °What are some general breast pumping tips? °· Wash your hands before you pump. You do not need to wash your  nipples or breasts. °· There are three ways to pump. °? You can use your hand to massage and compress your breast. °? You can use a handheld manual pump. °? You can use an electric pump. °· Make sure the suction cup (flange) on the breast pump is the right size. Place the flange directly over the nipple. If it is the wrong size or placed the wrong way, it may be painful and cause nipple damage. °· If pumping is uncomfortable, apply a small amount of purified or modified lanolin to your nipple and areola. °· If you are using an electric pump, adjust the speed and suction power to be more comfortable. °· If pumping is painful or if you are not getting very much milk, you may need a different type of pump. A lactation consultant can help you determine what type of pump to use. °· Keep a full water bottle near you at all times. Drinking lots of fluid helps you make more milk. °· You can store your milk to use later. Pumped breast milk can be stored in a sealable, sterile container or plastic bag. Label all stored breast milk with the date you pumped it. °? Milk can stay out at room temperature for up to 8 hours. °? You can store your milk in the refrigerator for up to 8 days. °? You can   store your milk in the freezer for 3 months. Thaw frozen milk using warm water. Do not put it in the microwave. °· Do not smoke. Smoking can lower your milk supply and harm your infant. If you need help quitting, ask your health care provider to recommend a program. °When should I call my health care provider or a lactation consultant? °· You are having trouble pumping. °· You are concerned that you are not making enough milk. °· You have nipple pain, soreness, or redness. °· You want to use birth control. Birth control pills may lower your milk supply. Talk to your health care provider about your options. °This information is not intended to replace advice given to you by your health care provider. Make sure you discuss any questions  you have with your health care provider. °Document Released: 12/26/2009 Document Revised: 12/20/2015 Document Reviewed: 04/30/2013 °Elsevier Interactive Patient Education © 2017 [employer] ° °

## 2017-01-17 NOTE — Progress Notes (Signed)
Post Partum Exam  Maria Kelly is a 26 y.o. 61P1001 female who presents for a postpartum visit. She is 4 weeks postpartum following a spontaneous vaginal delivery. I have fully reviewed the prenatal and intrapartum course. The delivery was at 40 gestational weeks.  Anesthesia: none. Postpartum course has been unremarkable other than having to treat mastitis. Baby's course has been unremarkable. Baby is feeding by breast. Bleeding thin lochia. Bowel function is normal. Bladder function is normal. Patient is not sexually active. Contraception method is abstinence. Plans POPs. Postpartum depression screening:neg (score 0).  The following portions of the patient's history were reviewed and updated as appropriate: allergies, current medications, past family history, past medical history, past social history, past surgical history and problem list.  Last Pap 2017, Nml.   Review of Systems Pertinent items are noted in HPI.    Has Mastitis. Resolved w/  Abx.  Objective:  Blood pressure 107/66, pulse 76, height 5\' 5"  (1.651 m), weight 124 lb (56.2 kg), currently breastfeeding.  General:  alert, cooperative, appears stated age and no distress  Neck No Thyromegaly   Breasts:  declined  Lungs: clear to auscultation bilaterally  Heart:  regular rate and rhythm, S1, S2 normal, no murmur, click, rub or gallop  Abdomen: soft, non-tender; bowel sounds normal; no masses,  no organomegaly   Vulva:  not evaluated        Assessment:    Nml postpartum exam. Pap smear not done at today's visit.   Plan:   1. Contraception: oral progesterone-only contraceptive 2. Mastitis prevention discussed 3. Follow up in: 1 year or as needed.   Katrinka BlazingSmith, [place], CNM 01/17/2017 10:56 AM

## 2017-01-18 DIAGNOSIS — M9915 Subluxation complex (vertebral) of pelvic region: Secondary | ICD-10-CM | POA: Diagnosis not present

## 2017-01-18 DIAGNOSIS — M9913 Subluxation complex (vertebral) of lumbar region: Secondary | ICD-10-CM | POA: Diagnosis not present

## 2017-01-18 DIAGNOSIS — M9914 Subluxation complex (vertebral) of sacral region: Secondary | ICD-10-CM | POA: Diagnosis not present

## 2017-01-18 DIAGNOSIS — M545 Low back pain: Secondary | ICD-10-CM | POA: Diagnosis not present

## 2017-01-20 DIAGNOSIS — M9915 Subluxation complex (vertebral) of pelvic region: Secondary | ICD-10-CM | POA: Diagnosis not present

## 2017-01-20 DIAGNOSIS — M545 Low back pain: Secondary | ICD-10-CM | POA: Diagnosis not present

## 2017-01-20 DIAGNOSIS — M9913 Subluxation complex (vertebral) of lumbar region: Secondary | ICD-10-CM | POA: Diagnosis not present

## 2017-01-20 DIAGNOSIS — M9914 Subluxation complex (vertebral) of sacral region: Secondary | ICD-10-CM | POA: Diagnosis not present

## 2017-01-21 DIAGNOSIS — M545 Low back pain: Secondary | ICD-10-CM | POA: Diagnosis not present

## 2017-02-06 DIAGNOSIS — M545 Low back pain: Secondary | ICD-10-CM | POA: Diagnosis not present

## 2017-02-07 DIAGNOSIS — M545 Low back pain: Secondary | ICD-10-CM | POA: Diagnosis not present

## 2017-02-11 DIAGNOSIS — M545 Low back pain: Secondary | ICD-10-CM | POA: Diagnosis not present

## 2017-02-12 DIAGNOSIS — M545 Low back pain: Secondary | ICD-10-CM | POA: Diagnosis not present

## 2017-02-13 DIAGNOSIS — M545 Low back pain: Secondary | ICD-10-CM | POA: Diagnosis not present

## 2017-02-18 DIAGNOSIS — M545 Low back pain: Secondary | ICD-10-CM | POA: Diagnosis not present

## 2017-02-25 DIAGNOSIS — M545 Low back pain: Secondary | ICD-10-CM | POA: Diagnosis not present

## 2017-03-05 DIAGNOSIS — M545 Low back pain: Secondary | ICD-10-CM | POA: Diagnosis not present

## 2017-03-10 DIAGNOSIS — M545 Low back pain: Secondary | ICD-10-CM | POA: Diagnosis not present

## 2017-03-18 DIAGNOSIS — M545 Low back pain: Secondary | ICD-10-CM | POA: Diagnosis not present

## 2017-04-18 ENCOUNTER — Ambulatory Visit (INDEPENDENT_AMBULATORY_CARE_PROVIDER_SITE_OTHER): Payer: BLUE CROSS/BLUE SHIELD | Admitting: Osteopathic Medicine

## 2017-04-18 DIAGNOSIS — Z23 Encounter for immunization: Secondary | ICD-10-CM

## 2017-08-11 ENCOUNTER — Ambulatory Visit (INDEPENDENT_AMBULATORY_CARE_PROVIDER_SITE_OTHER): Payer: BLUE CROSS/BLUE SHIELD | Admitting: Osteopathic Medicine

## 2017-08-11 ENCOUNTER — Encounter: Payer: Self-pay | Admitting: Osteopathic Medicine

## 2017-08-11 VITALS — BP 112/70 | HR 66 | Temp 97.7°F | Ht 63.0 in | Wt 117.1 lb

## 2017-08-11 DIAGNOSIS — Z0000 Encounter for general adult medical examination without abnormal findings: Secondary | ICD-10-CM

## 2017-08-11 LAB — COMPLETE METABOLIC PANEL WITH GFR
AG RATIO: 1.9 (calc) (ref 1.0–2.5)
ALT: 12 U/L (ref 6–29)
AST: 16 U/L (ref 10–30)
Albumin: 4.6 g/dL (ref 3.6–5.1)
Alkaline phosphatase (APISO): 72 U/L (ref 33–115)
BILIRUBIN TOTAL: 0.9 mg/dL (ref 0.2–1.2)
BUN: 12 mg/dL (ref 7–25)
CHLORIDE: 107 mmol/L (ref 98–110)
CO2: 29 mmol/L (ref 20–32)
Calcium: 9.6 mg/dL (ref 8.6–10.2)
Creat: 0.72 mg/dL (ref 0.50–1.10)
GFR, EST AFRICAN AMERICAN: 132 mL/min/{1.73_m2} (ref >=60)
GFR, Est Non African American: 114 mL/min/{1.73_m2} (ref >=60)
Globulin: 2.4 g/dL (calc) (ref 1.9–3.7)
Glucose, Bld: 87 mg/dL (ref 65–99)
POTASSIUM: 4.2 mmol/L (ref 3.5–5.3)
SODIUM: 141 mmol/L (ref 135–146)
TOTAL PROTEIN: 7 g/dL (ref 6.1–8.1)

## 2017-08-11 LAB — LIPID PANEL
Cholesterol: 136 mg/dL (ref <200)
HDL: 68 mg/dL (ref >50)
LDL Cholesterol (Calc): 57 mg/dL (calc)
Non-HDL Cholesterol (Calc): 68 mg/dL (calc) (ref <130)
TRIGLYCERIDES: 40 mg/dL (ref <150)
Total CHOL/HDL Ratio: 2 (calc) (ref <5.0)

## 2017-08-11 LAB — CBC
HCT: 39.2 % (ref 35.0–45.0)
Hemoglobin: 13 g/dL (ref 11.7–15.5)
MCH: 28.4 pg (ref 27.0–33.0)
MCHC: 33.2 g/dL (ref 32.0–36.0)
MCV: 85.8 fL (ref 80.0–100.0)
MPV: 10.7 fL (ref 7.5–12.5)
PLATELETS: 210 10*3/uL (ref 140–400)
RBC: 4.57 10*6/uL (ref 3.80–5.10)
RDW: 11.8 % (ref 11.0–15.0)
WBC: 6.1 10*3/uL (ref 3.8–10.8)

## 2017-08-11 LAB — TSH: TSH: 1.6 m[IU]/L

## 2017-08-11 NOTE — Progress Notes (Signed)
HPI: Maria Kelly is a 27 y.o. female  who presents to [facility] today, 08/11/17,  for chief complaint of:  Annual checkup    No complaints today. Patient presents health screening paperwork as required by her employer/insurance.     Past medical, surgical, social and family history reviewed: Patient Active Problem List   Diagnosis Date Noted  . Scoliosis 05/27/2016  . Psoriasis 02/05/2016   Past Surgical History:  Procedure Laterality Date  . NASAL SEPTUM SURGERY    . NASAL SINUS SURGERY     Social History   Tobacco Use  . Smoking status: Never Smoker  . Smokeless tobacco: Never Used  Substance Use Topics  . Alcohol use: No    Alcohol/week: 0.0 oz    Comment: occassional   Family History  Problem Relation Age of Onset  . Heart attack Paternal Grandfather   . Cancer Paternal Grandfather        brain  . Cancer Maternal Grandfather      Current medication list and allergy/intolerance information reviewed:   Current Outpatient Medications  Medication Sig Dispense Refill  . norethindrone (CAMILA) 0.35 MG tablet Take 1 tablet (0.35 mg total) by mouth daily. 1 Package 12  . Prenatal Vit-Fe Fumarate-FA (MULTIVITAMIN-PRENATAL) 27-0.8 MG TABS tablet Take 1 tablet by mouth daily at 12 noon.    Marland Kitchen ibuprofen (ADVIL,MOTRIN) 600 MG tablet Take 1 tablet (600 mg total) by mouth every 6 (six) hours. (Patient not taking: Reported on 01/17/2017) 30 tablet 0   No current facility-administered medications for this visit.    Allergies  Allergen Reactions  . Keflex [Cephalexin] Hives      Review of Systems:  Constitutional:  No  fever, no chills, No recent illnes  HEENT: No  headache, no vision change  Cardiac: No  chest pain, No  pressure, No palpitations  Respiratory:  No  shortness of breath. No  Cough  Gastrointestinal: No  abdominal pain, No  nausea  Musculoskeletal: No new myalgia/arthralgia  Skin: No  Rash  Neurologic: No   weakness, No  dizziness,   Psychiatric: No  concerns with depression, No  concerns with anxiety  Exam:  BP 112/70   Pulse 66   Temp 97.7 F (36.5 C) (Oral)   Ht 5\' 3"  (1.6 m)   Wt 117 lb 1.9 oz (53.1 kg)   BMI 20.75 kg/m   Constitutional: VS see above. General Appearance: alert, well-developed, well-nourished, NAD  Eyes: Normal lids and conjunctive, non-icteric sclera  Ears, Nose, Mouth, Throat: MMM, Normal external inspection ears/nares/mouth/lips/gums. TM normal bilaterally. Pharynx/tonsils no erythema, no exudate. Nasal mucosa normal. EOMI, PERRL  Neck: No masses, trachea midline.   Respiratory: Normal respiratory effort. no wheeze, no rhonchi, no rales  Cardiovascular: S1/S2 normal, very faint systolic murmur, no rub/gallop auscultated. RRR.   Musculoskeletal: Gait normal. No clubbing/cyanosis of digits.   Neurological: Normal balance/coordination. No tremor.  Skin: warm, dry, intact. No rash/ulcer. No concerning nevi or subq nodules on limited exam.    Psychiatric: Normal judgment/insight. Normal mood and affect. Oriented x3.     ASSESSMENT/PLAN: The encounter diagnosis was Annual physical exam.   Orders Placed This Encounter  Procedures  . CBC  . COMPLETE METABOLIC PANEL WITH GFR  . Lipid panel  . TSH      FEMALE PREVENTIVE CARE Updated 08/11/17   ANNUAL SCREENING/COUNSELING  Diet/Exercise - HEALTHY HABITS DISCUSSED TO DECREASE CV RISK Social History   Tobacco Use  Smoking Status Never Smoker  Smokeless Tobacco Never Used   Social History   Substance and Sexual Activity  Alcohol Use No  . Alcohol/week: 0.0 oz   Comment: occassional     Domestic violence concerns - no  HTN SCREENING - SEE VITALS  SEXUAL HEALTH  Sexually active in the past year - Yes with female.  Need/want STI testing today? - no  Concerns about libido or pain with sex? - no  Plans for pregnancy? - will start tryin again end of this year   INFECTIOUS DISEASE  SCREENING  HIV - does not need - neg on OB and no new risk factors   GC/CT - does not need  HepC - DOB [birth year] - does not need  TB - does not need  DISEASE SCREENING  Lipid - needs for biometric   DM2 - does not need  Osteoporosis - women age 73+ - does not need  CANCER SCREENING  Cervical - does not need - 01/2016 NILM, due in 3 years   Breast - does not need  Lung - does not need  Colon - does not need  ADULT VACCINATION  Influenza - annual vaccine recommended - UTD at this time  Td - booster every 10 years and w/ 3rd TM pregnancy  Zoster - option at 9050, yes at 60+   PCV13 - was not indicated  PPSV23 - was not indicated Immunization History  Administered Date(s) Administered  . Influenza,inj,Quad PF,6+ Mos 04/15/2016, 04/18/2017  . Tdap 09/18/2016       Visit summary with medication list and pertinent instructions was printed for patient to review. All questions at time of visit were answered - patient instructed to contact office with any additional concerns. ER/RTC precautions were reviewed with the patient. Follow-up plan: Return in about 1 year (around 08/11/2018) [facility], sooner if needed .

## 2017-08-29 DIAGNOSIS — S52501A Unspecified fracture of the lower end of right radius, initial encounter for closed fracture: Secondary | ICD-10-CM | POA: Diagnosis not present

## 2017-08-29 DIAGNOSIS — S52502A Unspecified fracture of the lower end of left radius, initial encounter for closed fracture: Secondary | ICD-10-CM | POA: Diagnosis not present

## 2017-09-01 ENCOUNTER — Ambulatory Visit (INDEPENDENT_AMBULATORY_CARE_PROVIDER_SITE_OTHER): Payer: BLUE CROSS/BLUE SHIELD

## 2017-09-01 ENCOUNTER — Encounter: Payer: Self-pay | Admitting: Sports Medicine

## 2017-09-01 ENCOUNTER — Ambulatory Visit (INDEPENDENT_AMBULATORY_CARE_PROVIDER_SITE_OTHER): Payer: BLUE CROSS/BLUE SHIELD | Admitting: Sports Medicine

## 2017-09-01 DIAGNOSIS — S52591A Other fractures of lower end of right radius, initial encounter for closed fracture: Secondary | ICD-10-CM | POA: Diagnosis not present

## 2017-09-01 DIAGNOSIS — W19XXXA Unspecified fall, initial encounter: Secondary | ICD-10-CM

## 2017-09-01 DIAGNOSIS — Y9329 Activity, other involving ice and snow: Secondary | ICD-10-CM

## 2017-09-01 DIAGNOSIS — M25532 Pain in left wrist: Secondary | ICD-10-CM | POA: Diagnosis not present

## 2017-09-01 DIAGNOSIS — S5291XA Unspecified fracture of right forearm, initial encounter for closed fracture: Secondary | ICD-10-CM | POA: Diagnosis not present

## 2017-09-01 DIAGNOSIS — S52501A Unspecified fracture of the lower end of right radius, initial encounter for closed fracture: Secondary | ICD-10-CM

## 2017-09-01 DIAGNOSIS — S6992XA Unspecified injury of left wrist, hand and finger(s), initial encounter: Secondary | ICD-10-CM | POA: Diagnosis not present

## 2017-09-01 DIAGNOSIS — S5292XA Unspecified fracture of left forearm, initial encounter for closed fracture: Secondary | ICD-10-CM | POA: Diagnosis not present

## 2017-09-01 DIAGNOSIS — M25531 Pain in right wrist: Secondary | ICD-10-CM | POA: Diagnosis not present

## 2017-09-01 NOTE — Progress Notes (Signed)
Subjective:    I'm seeing this patient as a consultation for: Dr. Sunnie Nielsen  CC: Bilateral wrist fractures  HPI: This weekend this pleasant 27 year old female was snowboarding, she fell backwards, hurt both of her wrist.  Ultimately an x-ray in [facility] emergency department showed fractures of both distal radii as well as a possible fracture of the right scaphoid.  She was placed in bilateral volar slab splint and referred to me for further evaluation and definitive treatment.  Pain is mild, persistent, moderate swelling, no radiation.  I reviewed the past medical history, family history, social history, surgical history, and allergies today and no changes were needed.  Please see the problem list section below in epic for further details.  Past Medical History: Past Medical History:  Diagnosis Date  . Indication for care in labor or delivery 12/19/2016  . Psoriasis   . Psoriatic arthritis (HCC)   . Scoliosis    Past Surgical History: Past Surgical History:  Procedure Laterality Date  . NASAL SEPTUM SURGERY    . NASAL SINUS SURGERY     Social History: Social History   Socioeconomic History  . Marital status: Married    Spouse name: None  . Number of children: None  . Years of education: None  . Highest education level: None  Social Needs  . Financial resource strain: None  . Food insecurity - worry: None  . Food insecurity - inability: None  . Transportation needs - medical: None  . Transportation needs - non-medical: None  Occupational History  . Occupation: [employer]   Tobacco Use  . Smoking status: Never Smoker  . Smokeless tobacco: Never Used  Substance and Sexual Activity  . Alcohol use: No    Alcohol/week: 0.0 oz    Comment: occassional  . Drug use: No  . Sexual activity: Yes    Partners: Male    Birth control/protection: None  Other Topics Concern  . None  Social History Narrative  . None   Family History: Family History  Problem Relation Age of  Onset  . Heart attack Paternal Grandfather   . Cancer Paternal Grandfather        brain   Allergies: Allergies  Allergen Reactions  . Keflex [Cephalexin] Hives   Medications: See med rec.  Review of Systems: No headache, visual changes, nausea, vomiting, diarrhea, constipation, dizziness, abdominal pain, skin rash, fevers, chills, night sweats, weight loss, swollen lymph nodes, body aches, joint swelling, muscle aches, chest pain, shortness of breath, mood changes, visual or auditory hallucinations.   Objective:   General: Well Developed, well nourished, and in no acute distress.  Neuro:  Extra-ocular muscles intact, able to move all 4 extremities, sensation grossly intact.  Deep tendon reflexes tested were normal. Psych: Alert and oriented, mood congruent with affect. ENT:  Ears and nose appear unremarkable.  Hearing grossly normal. Neck: Unremarkable overall appearance, trachea midline.  No visible thyroid enlargement. Eyes: Conjunctivae and lids appear unremarkable.  Pupils equal and round. Skin: Warm and dry, no rashes noted.  Cardiovascular: Pulses palpable, no extremity edema. Wrists: Both are swollen, there is tenderness over the distal radius bilaterally, only minimal tenderness in the snuffbox on the right.  X-rays reviewed, there are nondisplaced, linear fractures through the distal radius, radial height, inclination, volar tilt are all maintained.  On the right side I do see a question of deformity of the right scaphoid.  Impression and Recommendations:   This case required medical decision making of moderate complexity.  Fracture  of forearm, right, closed I do see a distal radius fracture, nondisplaced, nonangulated, I also see a hint of a scaphoid fracture. Thumb spica Exos cast. CT for confirmation.  I billed a fracture code for this encounter, all subsequent visits will be post-op checks in the global period.  Fracture of forearm, left, closed Nondisplaced,  nonangulated distal radius fracture. Exos cast placed.  I billed a fracture code for this encounter, all subsequent visits will be post-op checks in the global period. ___________________________________________ Ihor Austinhomas J. Benjamin Stainhekkekandam, M.D., ABFM., CAQSM. Primary Care and Sports Medicine [facility]  Adjunct Instructor of Family Medicine  [employer]

## 2017-09-01 NOTE — Assessment & Plan Note (Addendum)
Nondisplaced, nonangulated distal radius fracture. Exos cast placed.  I billed a fracture code for this encounter, all subsequent visits will be post-op checks in the global period.

## 2017-09-01 NOTE — Assessment & Plan Note (Addendum)
I do see a distal radius fracture, nondisplaced, nonangulated, I also see a hint of a scaphoid fracture. Thumb spica Exos cast. CT for confirmation.  I billed a fracture code for this encounter, all subsequent visits will be post-op checks in the global period.

## 2017-09-29 ENCOUNTER — Ambulatory Visit (INDEPENDENT_AMBULATORY_CARE_PROVIDER_SITE_OTHER): Payer: BLUE CROSS/BLUE SHIELD | Admitting: Sports Medicine

## 2017-09-29 ENCOUNTER — Encounter: Payer: Self-pay | Admitting: Sports Medicine

## 2017-09-29 DIAGNOSIS — S5292XD Unspecified fracture of left forearm, subsequent encounter for closed fracture with routine healing: Secondary | ICD-10-CM

## 2017-09-29 DIAGNOSIS — S5291XD Unspecified fracture of right forearm, subsequent encounter for closed fracture with routine healing: Secondary | ICD-10-CM

## 2017-09-29 NOTE — Assessment & Plan Note (Signed)
Clinically healed, follow-up one more time in a month to make sure things are going well.   Discontinue Exos cast. 

## 2017-09-29 NOTE — Assessment & Plan Note (Signed)
Clinically healed, follow-up one more time in a month to make sure things are going well.   Discontinue Exos cast.

## 2017-09-29 NOTE — Progress Notes (Signed)
  Subjective: This is a pleasant 27 year old female, she returns after bilateral distal radius fractures while skiing about 4-1/2 weeks ago.  She has been in Exos cast, doing extremely well, no pain.  Objective: General: Well-developed, well-nourished, and in no acute distress. Wrists: Swelling is gone, no longer tender over the fracture, range of motion is somewhat decreased as expected due to immobilization.  Assessment/plan:   Fracture of forearm, right, closed Clinically healed, follow-up one more time in a month to make sure things are going well.   Discontinue Exos cast.  Fracture of forearm, left, closed Clinically healed, follow-up one more time in a month to make sure things are going well.   Discontinue Exos cast.  ___________________________________________ Ihor Austinhomas J. Benjamin Stainhekkekandam, M.D., ABFM., CAQSM. Primary Care and Sports Medicine [facility]  Adjunct Instructor of Family Medicine  [employer]

## 2017-10-27 ENCOUNTER — Encounter: Payer: Self-pay | Admitting: Sports Medicine

## 2017-10-27 ENCOUNTER — Ambulatory Visit (INDEPENDENT_AMBULATORY_CARE_PROVIDER_SITE_OTHER): Payer: BLUE CROSS/BLUE SHIELD | Admitting: Sports Medicine

## 2017-10-27 DIAGNOSIS — S5292XD Unspecified fracture of left forearm, subsequent encounter for closed fracture with routine healing: Secondary | ICD-10-CM

## 2017-10-27 DIAGNOSIS — S5291XD Unspecified fracture of right forearm, subsequent encounter for closed fracture with routine healing: Secondary | ICD-10-CM

## 2017-10-27 NOTE — Assessment & Plan Note (Signed)
Resolved, return as needed 

## 2017-10-27 NOTE — Progress Notes (Signed)
  Subjective: This is a pleasant 27 year old female, she is here for follow-up of bilateral distal radius fractures, she is pain-free, full range of motion, no complaints.  Objective: General: Well-developed, well-nourished, and in no acute distress. Bilateral wrists: Inspection normal with no visible erythema or swelling. ROM smooth and normal with good flexion and extension and ulnar/radial deviation that is symmetrical with opposite wrist. Palpation is normal over metacarpals, navicular, lunate, and TFCC; tendons without tenderness/ swelling No snuffbox tenderness. No tenderness over Canal of Guyon. Strength 5/5 in all directions without pain. Negative tinel's and phalens signs. Negative Finkelstein sign. Negative Watson's test.  Assessment/plan:   Fracture of forearm, left, closed Resolved, return as needed.  Fracture of forearm, right, closed Resolved, return as needed. ___________________________________________ Ihor Austinhomas J. Benjamin Stainhekkekandam, M.D., ABFM., CAQSM. Primary Care and Sports Medicine [facility]  Adjunct Instructor of Family Medicine  [employer]

## 2017-11-25 ENCOUNTER — Other Ambulatory Visit: Payer: Self-pay | Admitting: *Deleted

## 2017-11-25 DIAGNOSIS — Z30011 Encounter for initial prescription of contraceptive pills: Secondary | ICD-10-CM

## 2017-11-25 MED ORDER — NORETHINDRONE 0.35 MG PO TABS: 1.0000 | ORAL_TABLET | Freq: Every day | ORAL | 0 refills | Status: DC

## 2017-11-25 NOTE — Telephone Encounter (Signed)
Pt given authorization for 1 90 supply of Camilla but will be due for her annual 6/19.  RF sent to [employer].

## 2018-02-02 ENCOUNTER — Ambulatory Visit (INDEPENDENT_AMBULATORY_CARE_PROVIDER_SITE_OTHER): Payer: BLUE CROSS/BLUE SHIELD | Admitting: Obstetrics & Gynecology

## 2018-02-02 ENCOUNTER — Encounter: Payer: Self-pay | Admitting: Obstetrics & Gynecology

## 2018-02-02 VITALS — BP 126/73 | HR 76 | Resp 16 | Ht 65.0 in | Wt 117.0 lb

## 2018-02-02 DIAGNOSIS — Z01419 Encounter for gynecological examination (general) (routine) without abnormal findings: Secondary | ICD-10-CM

## 2018-02-02 DIAGNOSIS — Z124 Encounter for screening for malignant neoplasm of cervix: Secondary | ICD-10-CM | POA: Diagnosis not present

## 2018-02-02 MED ORDER — NORGESTREL-ETHINYL ESTRADIOL 0.3-30 MG-MCG PO TABS: 1.0000 | ORAL_TABLET | Freq: Every day | ORAL | 11 refills | Status: DC

## 2018-02-02 NOTE — Progress Notes (Signed)
Subjective:    Maria Kelly is a 27 y.o. married 74P1 (29 yo son) female who presents for an annual exam. The patient has no complaints today. She has finished breastfeeding, made it to 11 months. The patient is sexually active. GYN screening history: last pap: was normal. The patient wears seatbelts: yes. The patient participates in regular exercise: yes. Has the patient ever been transfused or tattooed?: yes. The patient reports that there is not domestic violence in her life.   Menstrual History: OB History    Gravida  1   Para  1   Term  1   Preterm  0   AB  0   Living  1     SAB  0   TAB  0   Ectopic  0   Multiple  0   Live Births  1           Menarche age: [age] Patient's last menstrual period was 01/12/2018.    The following portions of the patient's history were reviewed and updated as appropriate: allergies, current medications, past family history, past medical history, past social history, past surgical history and problem list.  Review of Systems Pertinent items are noted in HPI.   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 Married for 5 years She got Gardasil series   Objective:    BP 126/73   Pulse 76   Resp 16   Ht 5\' 5"  (1.651 m)   Wt 117 lb (53.1 kg)   LMP 01/12/2018   BMI 19.47 kg/m   General Appearance:    Alert, cooperative, no distress, appears stated age  Head:    Normocephalic, without obvious abnormality, atraumatic  Eyes:    PERRL, conjunctiva/corneas clear, EOM's intact, fundi    benign, both eyes  Ears:    Normal TM's and external ear canals, both ears  Nose:   Nares normal, septum midline, mucosa normal, no drainage    or sinus tenderness  Throat:   Lips, mucosa, and tongue normal; teeth and gums normal  Neck:   Supple, symmetrical, trachea midline, no adenopathy;    thyroid:  no enlargement/tenderness/nodules; no carotid   bruit or JVD   Back:     Symmetric, no curvature, ROM normal, no CVA tenderness  Lungs:     Clear to auscultation bilaterally, respirations unlabored  Chest Wall:    No tenderness or deformity   Heart:    Regular rate and rhythm, S1 and S2 normal, no murmur, rub   or gallop  Breast Exam:    No tenderness, masses, or nipple abnormality  Abdomen:     Soft, non-tender, bowel sounds active all four quadrants,    no masses, no organomegaly  Genitalia:    Normal female without lesion, discharge or tenderness, normal size and shape, retroverted, mobile, non-tender, normal adnexal exam      Extremities:   Extremities normal, atraumatic, no cyanosis or edema  Pulses:   2+ and symmetric all extremities  Skin:   Skin color, texture, turgor normal, no rashes or lesions  Lymph nodes:   Cervical, supraclavicular, and axillary nodes normal  Neurologic:   CNII-XII intact, normal strength, sensation and reflexes    throughout  .    Assessment:    Healthy female exam.    Plan:     Thin prep Pap smear.   Rec MVI/PNVs Change from POP to OCPs (lo ovral  prescribed

## 2018-02-03 LAB — CYTOLOGY - PAP: Diagnosis: NEGATIVE

## 2018-05-26 IMAGING — DX DG WRIST COMPLETE 3+V*L*
4 series · 4 of 4 positions shown · non-contrast
Comparison: None.

CLINICAL DATA: Left wrist pain after fall snowboarding 2 days ago.

EXAM:
LEFT WRIST - COMPLETE 3+ VIEW

[wrist pa]
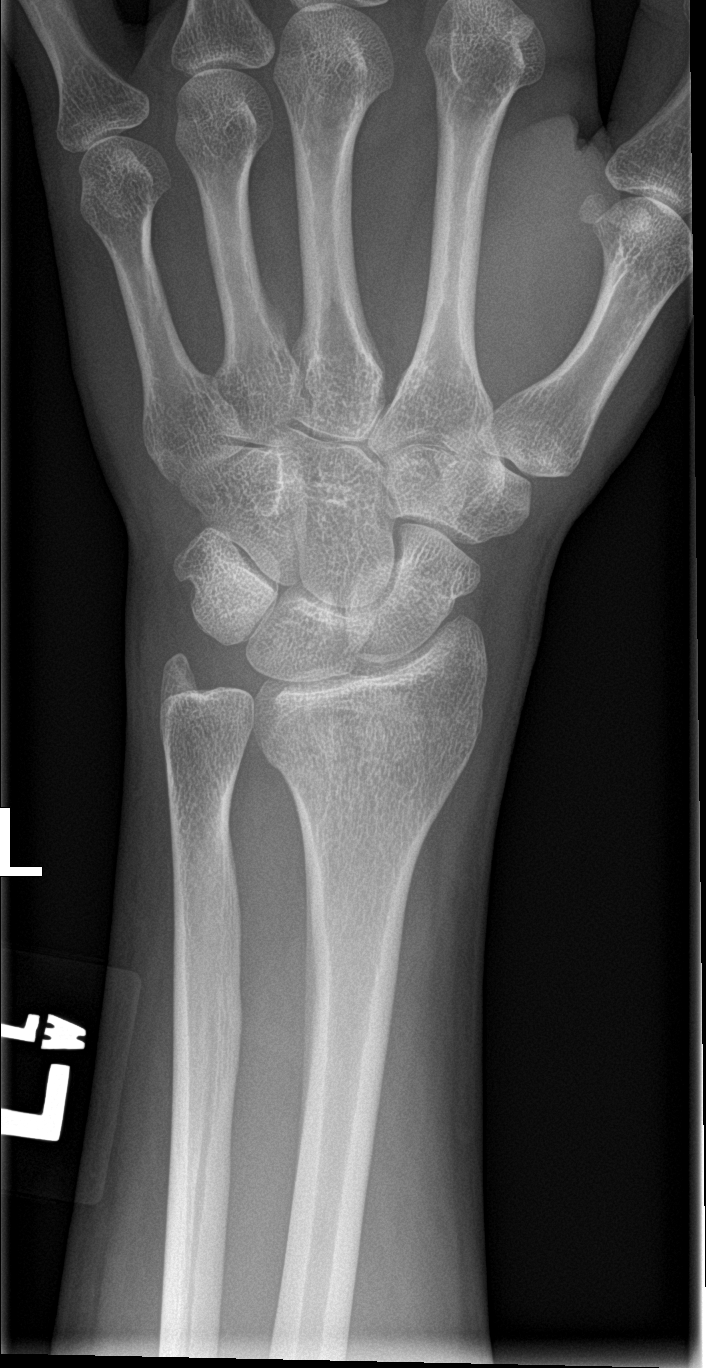

[wrist obl]
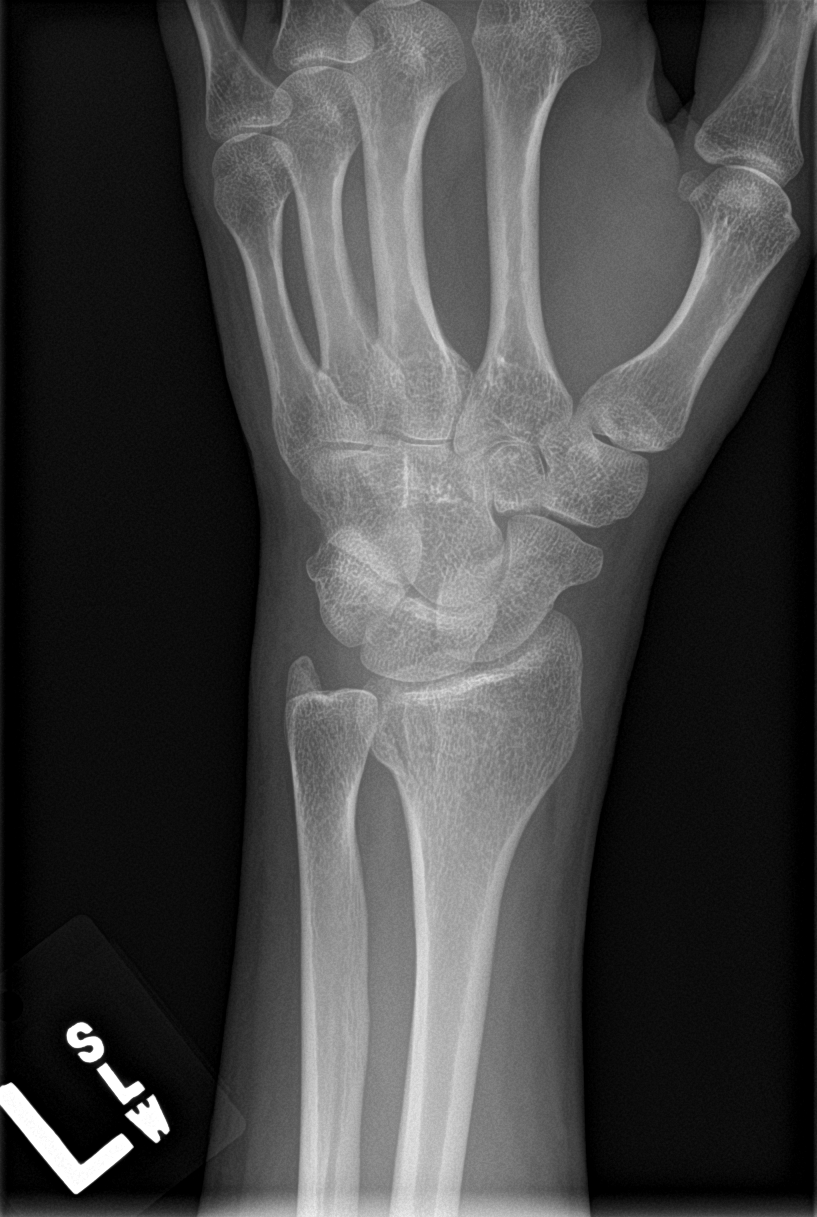

[wrist lat]
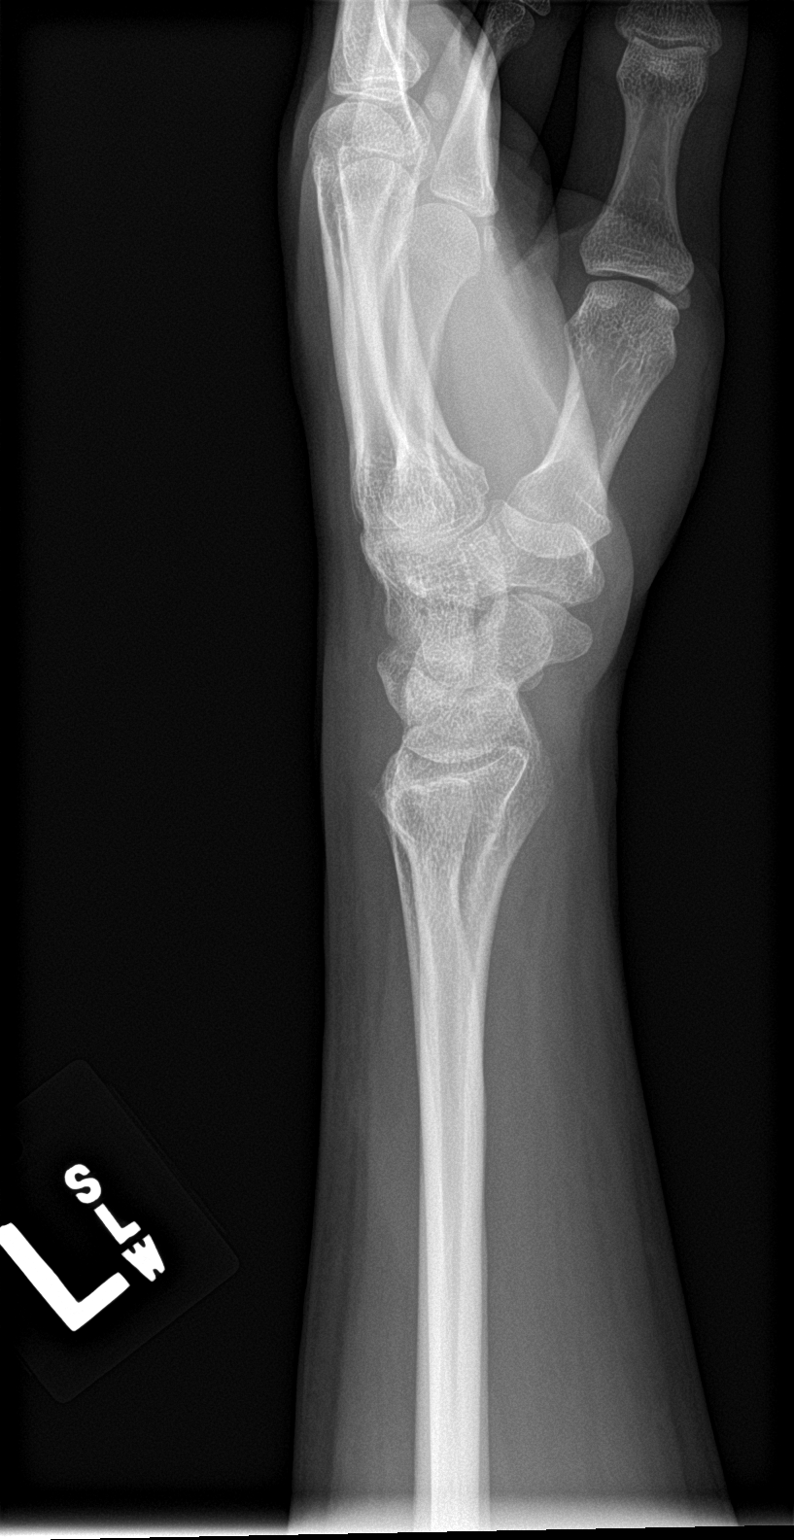

[wrist navicular]
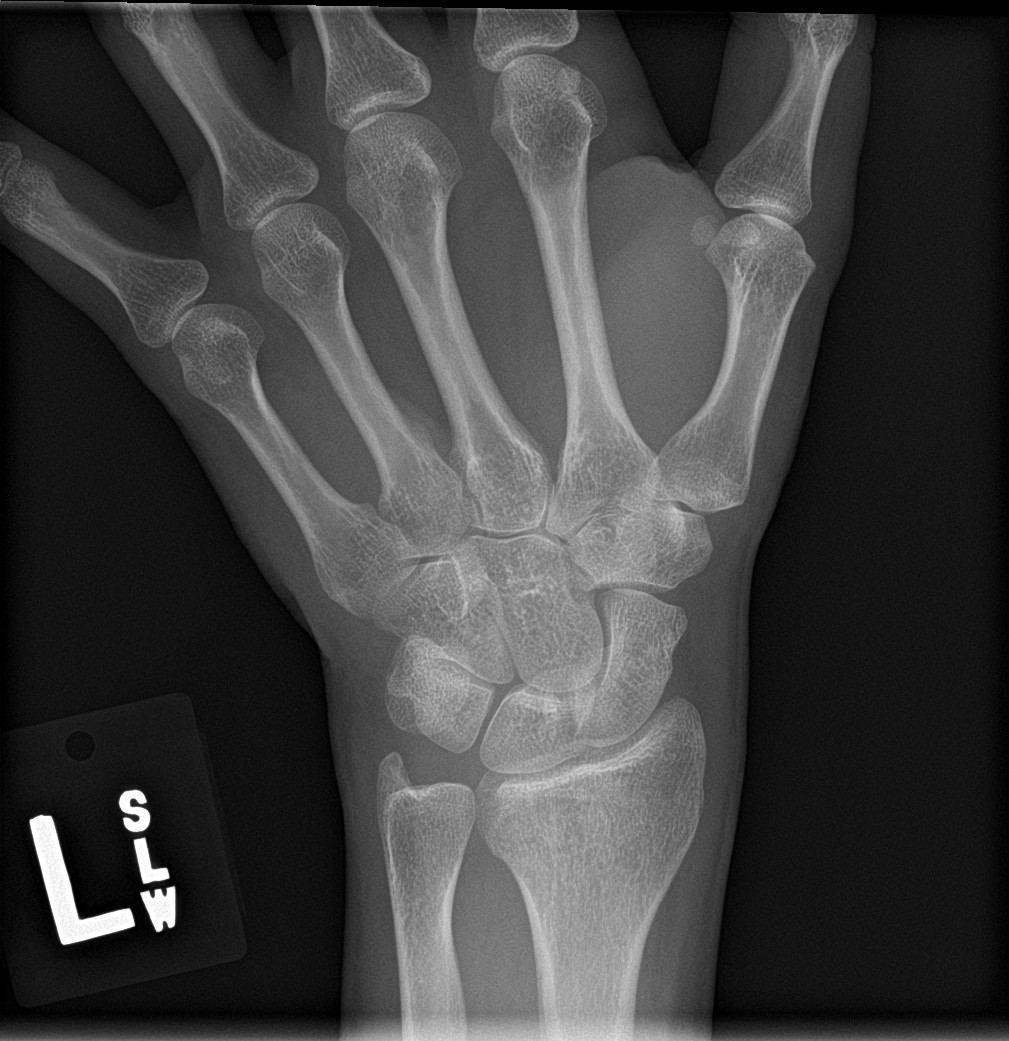

[4 of 4 positions shown; findings below may reference images not displayed]

FINDINGS: There is no evidence of fracture or dislocation. There is no
evidence of arthropathy or other focal bone abnormality. Soft
tissues are unremarkable.
IMPRESSION: Negative.

## 2018-06-15 ENCOUNTER — Encounter: Payer: Self-pay | Admitting: Emergency Medicine

## 2018-06-15 ENCOUNTER — Emergency Department (INDEPENDENT_AMBULATORY_CARE_PROVIDER_SITE_OTHER): Payer: BLUE CROSS/BLUE SHIELD

## 2018-06-15 ENCOUNTER — Emergency Department (INDEPENDENT_AMBULATORY_CARE_PROVIDER_SITE_OTHER)
Admission: EM | Admit: 2018-06-15 | Discharge: 2018-06-15 | Disposition: A | Discharge status: Home / Self Care | Payer: BLUE CROSS/BLUE SHIELD | Source: Home / Self Care | Attending: Family Medicine | Admitting: Family Medicine

## 2018-06-15 DIAGNOSIS — R1011 Right upper quadrant pain: Secondary | ICD-10-CM

## 2018-06-15 DIAGNOSIS — R1033 Periumbilical pain: Secondary | ICD-10-CM | POA: Diagnosis not present

## 2018-06-15 DIAGNOSIS — R1013 Epigastric pain: Secondary | ICD-10-CM

## 2018-06-15 DIAGNOSIS — R110 Nausea: Secondary | ICD-10-CM

## 2018-06-15 LAB — POCT URINALYSIS DIP (MANUAL ENTRY)
Bilirubin, UA: NEGATIVE
Glucose, UA: NEGATIVE mg/dL
Ketones, POC UA: NEGATIVE mg/dL
Nitrite, UA: NEGATIVE
Protein Ur, POC: NEGATIVE mg/dL
Spec Grav, UA: 1.015 (ref 1.010–1.025)
Urobilinogen, UA: 0.2 E.U./dL
pH, UA: 7 (ref 5.0–8.0)

## 2018-06-15 LAB — POCT CBC W AUTO DIFF (K'VILLE URGENT CARE)

## 2018-06-15 MED ORDER — OMEPRAZOLE 20 MG PO CPDR: 20.0000 mg | DELAYED_RELEASE_CAPSULE | Freq: Two times a day (BID) | ORAL | 0 refills | Status: DC

## 2018-06-15 NOTE — Discharge Instructions (Signed)
°  Please try the medication sent to your pharmacy.  If symptoms still not improving, please follow up with family medicine in 1-2 weeks.  Call 911 or go to the hospital if symptoms significantly worsening.

## 2018-06-15 NOTE — ED Triage Notes (Signed)
Pt c/o low back pain, nausea and stomach pain after eating. Started yesterday. Denies urinary sxs.

## 2018-06-15 NOTE — ED Provider Notes (Signed)
[facility]    CSN: [id number] Arrival date & time: 06/15/18  1156     History   Chief Complaint Chief Complaint  Patient presents with  . Back Pain    HPI Jannet Askewlisha Wuebker is a 27 y.o. female.   HPI  Jannet Askewlisha Sowder is a 27 y.o. female presenting to UC with c/o bilateral low back pain that started last night with nausea , which developed into upper abdominal pain after eating.  Pain is aching and sharp at times. She tried eating lasagna for lunch but had to stop due to the pain. She has tried ibuprofen w/o relief. Associated mild nausea but no vomiting or diarrhea. No fever or chills. No urinary symptoms. No personal hx of gallbladder issues but her grandparents have had their gallbladders removed.    Past Medical History:  Diagnosis Date  . Indication for care in labor or delivery 12/19/2016  . Psoriasis   . Psoriatic arthritis (HCC)   . Scoliosis     Patient Active Problem List   Diagnosis Date Noted  . Fracture of forearm, left, closed 09/01/2017  . Fracture of forearm, right, closed 09/01/2017  . Scoliosis 05/27/2016  . Psoriasis 02/05/2016    Past Surgical History:  Procedure Laterality Date  . NASAL SEPTUM SURGERY    . NASAL SINUS SURGERY      OB History    Gravida  1   Para  1   Term  1   Preterm  0   AB  0   Living  1     SAB  0   TAB  0   Ectopic  0   Multiple  0   Live Births  1            Home Medications    Prior to Admission medications   Medication Sig Start Date End Date Taking? Authorizing Provider  Multiple Vitamin (MULTIVITAMIN) tablet Take 1 tablet by mouth daily.    [provider]  norethindrone (CAMILA) 0.35 MG tablet Take 1 tablet (0.35 mg total) by mouth daily. 11/25/17   Lesly DukesLeggett, Kelly H, MD  norgestrel-ethinyl estradiol (LO/OVRAL,CRYSELLE) 0.3-30 MG-MCG tablet Take 1 tablet by mouth daily. 02/02/18   Allie Bossierove, Myra C, MD  omeprazole (PRILOSEC) 20 MG capsule Take 1 capsule (20 mg total) by mouth 2  (two) times daily before a meal. 06/15/18   Lurene ShadowPhelps, Darlin Stenseth O, PA-C    Family History Family History  Problem Relation Age of Onset  . Heart attack Paternal Grandfather   . Cancer Paternal Grandfather        brain    Social History Social History   Tobacco Use  . Smoking status: Never Smoker  . Smokeless tobacco: Never Used  Substance Use Topics  . Alcohol use: No    Alcohol/week: 0.0 standard drinks    Comment: occassional  . Drug use: No     Allergies   Keflex [cephalexin]   Review of Systems Review of Systems  Constitutional: Negative for appetite change, chills, fatigue and fever.  Gastrointestinal: Positive for abdominal pain and nausea. Negative for diarrhea and vomiting.  Genitourinary: Negative for dysuria, flank pain, frequency and hematuria.  Musculoskeletal: Positive for back pain. Negative for myalgias.     Physical Exam Triage Vital Signs ED Triage Vitals  Enc Vitals Group     BP 06/15/18 1217 135/84     Pulse Rate 06/15/18 1217 70     Resp --      Temp 06/15/18  1217 98 F (36.7 C)     Temp Source 06/15/18 1217 Oral     SpO2 06/15/18 1217 98 %     Weight 06/15/18 1218 115 lb (52.2 kg)     Height --      Head Circumference --      Peak Flow --      Pain Score 06/15/18 1218 0     Pain Loc --      Pain Edu? --      Excl. in GC? --    No data found.  Updated Vital Signs BP 135/84 (BP Location: Right Arm)   Pulse 70   Temp 98 F (36.7 C) (Oral)   Wt 115 lb (52.2 kg)   LMP 05/27/2018 (Approximate)   SpO2 98%   BMI 19.14 kg/m   Visual Acuity Right Eye Distance:   Left Eye Distance:   Bilateral Distance:    Right Eye Near:   Left Eye Near:    Bilateral Near:     Physical Exam  Constitutional: She is oriented to person, place, and time. She appears well-developed and well-nourished. No distress.  HENT:  Head: Normocephalic and atraumatic.  Mouth/Throat: Oropharynx is clear and moist.  Eyes: EOM are normal.  Neck: Normal range of  motion. Neck supple.  Cardiovascular: Normal rate and regular rhythm.  Pulmonary/Chest: Effort normal and breath sounds normal. No stridor. No respiratory distress. She has no wheezes. She has no rales.  Abdominal: Soft. She exhibits no distension. There is tenderness. There is no rigidity, no rebound, no guarding and no CVA tenderness.    Musculoskeletal: Normal range of motion.  Neurological: She is alert and oriented to person, place, and time.  Skin: Skin is warm and dry. She is not diaphoretic.  Psychiatric: She has a normal mood and affect. Her behavior is normal.  Nursing note and vitals reviewed.    UC Treatments / Results  Labs (all labs ordered are listed, but only abnormal results are displayed) Labs Reviewed  POCT URINALYSIS DIP (MANUAL ENTRY) - Abnormal; Notable for the following components:      Result Value   Clarity, UA cloudy (*)    Blood, UA trace-intact (*)    Leukocytes, UA Small (1+) (*)    All other components within normal limits  URINE CULTURE  COMPLETE METABOLIC PANEL WITH GFR  LIPASE  POCT CBC W AUTO DIFF ([facility])    EKG None  Radiology US Abdomen Complete  Result Date: 06/15/2018 CLINICAL DATA:  Initial evaluation for acute right upper quadrant and epigastric abdominal pain, nausea. EXAM: ABDOMEN ULTRASOUND COMPLETE COMPARISON:  None available. FINDINGS: Gallbladder: Gallbladder partially contracted on this examination. No internal stones or sludge. Gallbladder wall measured within normal limits of 1.2 mm in thickness. No free pericholecystic fluid. No sonographic Murphy sign elicited on exam. Common bile duct: Diameter: 2.5 mm Liver: No focal lesion identified. Within normal limits in parenchymal echogenicity. Portal vein is patent on color Doppler imaging with normal direction of blood flow towards the liver. IVC: No abnormality visualized. Pancreas: Visualized portion unremarkable. Spleen: Size and appearance within normal limits. Right  Kidney: Length: 10.9 cm. Echogenicity within normal limits. No mass or hydronephrosis visualized. Left Kidney: Length: 10.7 cm. Echogenicity within normal limits. No mass or hydronephrosis visualized. Abdominal aorta: No aneurysm visualized. Other findings: None. IMPRESSION: Normal abdominal ultrasound. Specifically, normal sonographic appearance of the gallbladder without evidence for cholelithiasis, acute cholecystitis, or biliary dilatation. Electronically Signed   By: Rise Mu  M.D.   On: 06/15/2018 13:26    Procedures Procedures (including critical care time)  Medications Ordered in UC Medications - No data to display  Initial Impression / Assessment and Plan / UC Course  I have reviewed the triage vital signs and the nursing notes.  Pertinent labs & imaging results that were available during my care of the patient were reviewed by me and considered in my medical decision making (see chart for details).     CBC: unremarkable U/S abd: unremarkable, no acute findings to explain pt's symptoms. Suspect duodenal or gastric ulcer Will have pt try trial of omeprazole Home care info provided.  Final Clinical Impressions(s) / UC Diagnoses   Final diagnoses:  Abdominal pain, epigastric  RUQ abdominal pain  Periumbilical pain     Discharge Instructions      Please try the medication sent to your pharmacy.  If symptoms still not improving, please follow up with family medicine in 1-2 weeks.  Call 911 or go to the hospital if symptoms significantly worsening.     ED Prescriptions    Medication Sig Dispense Auth. Provider   omeprazole (PRILOSEC) 20 MG capsule Take 1 capsule (20 mg total) by mouth 2 (two) times daily before a meal. 60 capsule Lurene Shadow, PA-C     Controlled Substance Prescriptions [place] Controlled Substance Registry consulted? Not Applicable   Rolla Plate 06/15/18 1449

## 2018-06-16 ENCOUNTER — Telehealth: Payer: Self-pay

## 2018-06-16 LAB — COMPLETE METABOLIC PANEL WITH GFR
AG Ratio: 1.7 (calc) (ref 1.0–2.5)
ALT: 18 U/L (ref 6–29)
AST: 17 U/L (ref 10–30)
Albumin: 4.3 g/dL (ref 3.6–5.1)
Alkaline phosphatase (APISO): 32 U/L — ABNORMAL LOW (ref 33–115)
BUN: 15 mg/dL (ref 7–25)
CO2: 28 mmol/L (ref 20–32)
Calcium: 9.7 mg/dL (ref 8.6–10.2)
Chloride: 104 mmol/L (ref 98–110)
Creat: 0.75 mg/dL (ref 0.50–1.10)
GFR, Est African American: 125 mL/min/{1.73_m2} (ref >=60)
GFR, Est Non African American: 108 mL/min/{1.73_m2} (ref >=60)
Globulin: 2.5 g/dL (calc) (ref 1.9–3.7)
Glucose, Bld: 69 mg/dL (ref 65–99)
Potassium: 4 mmol/L (ref 3.5–5.3)
Sodium: 139 mmol/L (ref 135–146)
Total Bilirubin: 0.5 mg/dL (ref 0.2–1.2)
Total Protein: 6.8 g/dL (ref 6.1–8.1)

## 2018-06-16 LAB — LIPASE: Lipase: 20 U/L (ref 7–60)

## 2018-06-16 LAB — UNLABELED: Test Ordered On Req: 395

## 2018-06-20 LAB — PAT ID TIQ DOC

## 2018-06-20 LAB — URINE CULTURE
MICRO NUMBER:: 91430739
SPECIMEN QUALITY:: ADEQUATE

## 2018-06-20 NOTE — Telephone Encounter (Signed)
Patient informed of results and states that she is doing much better. The antibiotics given have helped patient.  Will follow up as needed.

## 2018-06-30 ENCOUNTER — Encounter: Payer: Self-pay | Admitting: Advanced Practice Midwife

## 2018-06-30 ENCOUNTER — Ambulatory Visit (INDEPENDENT_AMBULATORY_CARE_PROVIDER_SITE_OTHER): Payer: BLUE CROSS/BLUE SHIELD | Admitting: Advanced Practice Midwife

## 2018-06-30 VITALS — BP 112/70 | HR 67 | Ht 65.0 in | Wt 118.0 lb

## 2018-06-30 DIAGNOSIS — Z3009 Encounter for other general counseling and advice on contraception: Secondary | ICD-10-CM

## 2018-06-30 DIAGNOSIS — Z3202 Encounter for pregnancy test, result negative: Secondary | ICD-10-CM | POA: Diagnosis not present

## 2018-06-30 DIAGNOSIS — Z01812 Encounter for preprocedural laboratory examination: Secondary | ICD-10-CM

## 2018-06-30 DIAGNOSIS — Z30430 Encounter for insertion of intrauterine contraceptive device: Secondary | ICD-10-CM | POA: Diagnosis not present

## 2018-06-30 LAB — POCT URINE PREGNANCY: PREG TEST UR: NEGATIVE

## 2018-06-30 MED ORDER — LEVONORGESTREL 19.5 MCG/DAY IU IUD
INTRAUTERINE_SYSTEM | Freq: Once | INTRAUTERINE | Status: AC
  Administered 2018-06-30: 14:00:00 via INTRAUTERINE

## 2018-06-30 NOTE — Progress Notes (Signed)
PT is currently on OCP and wants something different for birth control

## 2018-06-30 NOTE — Progress Notes (Signed)
  GYNECOLOGY PROGRESS NOTE  History:  27 y.o. G1P1001 [facility] for problem gyn visit. She reports headaches monthly with menses on OCPs.  She is able to take pill at same time each day but would like contraception she does not have to remember.  She denies h/a, dizziness, shortness of breath, n/v, or fever/chills.    The following portions of the patient's history were reviewed and updated as appropriate: allergies, current medications, past family history, past medical history, past social history, past surgical history and problem list. Last pap smear on 02/02/18 was normal.  Review of Systems:  Pertinent items are noted in HPI.   Objective:  Physical Exam Blood pressure 112/70, pulse 67, height 5\' 5"  (1.651 m), weight 53.5 kg, last menstrual period 06/25/2018, currently breastfeeding. VS reviewed, nursing note reviewed,  Constitutional: well developed, well nourished, no distress HEENT: normocephalic CV: normal rate Pulm/chest wall: normal effort Breast Exam: deferred Abdomen: soft Neuro: alert and oriented x 3 Skin: warm, dry Psych: affect normal Pelvic exam: Cervix pink, visually closed, without lesion, scant white creamy discharge, vaginal walls and external genitalia normal Bimanual exam: Cervix 0/long/high, firm, anterior, neg CMT, uterus nontender, nonenlarged, adnexa without tenderness, enlargement, or mass   IUD Procedure Note Patient identified, informed consent performed.  Discussed risks of irregular bleeding, cramping, infection, malpositioning or misplacement of the IUD outside the uterus which may require further procedures. Time out was performed.  Urine pregnancy test negative.  Speculum placed in the vagina.  Cervix visualized.  Cleaned with Betadine x 2.  Grasped anteriorly with a single tooth tenaculum.  Uterus sounded to 7.5 cm.  Liletta IUD placed per manufacturer's recommendations.  Strings trimmed to 3-4 cm. Tenaculum was removed, good  hemostasis noted.  Patient tolerated procedure well.   Patient was given post-procedure instructions and the Liletta care card with expiration date.  Patient was also asked to check IUD strings periodically and follow up in 4-6 weeks for IUD check.  Assessment & Plan:  1. Pre-procedure lab exam  - POCT urine pregnancy  2. Encounter for IUD insertion  - Levonorgestrel (LILETTA) 19.5 MCG/DAY IUD  3. Encounter for counseling regarding contraception --Discussed contraceptive options, including LARCs and use of OCPs consecutively to skip periods.  Pt desires IUD today.  See procedure note above. --Return in 4-6 weeks for string check  Sharen CounterLisa Leftwich-Kirby, CNM 5:08 PM

## 2018-07-28 ENCOUNTER — Ambulatory Visit: Payer: BLUE CROSS/BLUE SHIELD | Admitting: Certified Nurse Midwife

## 2018-08-04 ENCOUNTER — Ambulatory Visit (INDEPENDENT_AMBULATORY_CARE_PROVIDER_SITE_OTHER): Payer: BLUE CROSS/BLUE SHIELD | Admitting: Advanced Practice Midwife

## 2018-08-04 ENCOUNTER — Encounter: Payer: Self-pay | Admitting: Advanced Practice Midwife

## 2018-08-04 VITALS — BP 119/74 | HR 67 | Ht 65.0 in | Wt 120.0 lb

## 2018-08-04 DIAGNOSIS — Z975 Presence of (intrauterine) contraceptive device: Secondary | ICD-10-CM | POA: Diagnosis not present

## 2018-08-04 DIAGNOSIS — Z30431 Encounter for routine checking of intrauterine contraceptive device: Secondary | ICD-10-CM

## 2018-08-04 NOTE — Progress Notes (Signed)
  GYNECOLOGY CLINIC PROGRESS NOTE  History:  28 y.o. G1P1001 here today for today for IUD string check; Liletta IUD was placed  06/30/18. No complaints about the Liletta, no concerning side effects.  The following portions of the patient's history were reviewed and updated as appropriate: allergies, current medications, past family history, past medical history, past social history, past surgical history and problem list. Last pap smear on 02/02/2018 was normal.  Review of Systems:  Pertinent items are noted in HPI.   Objective:  Physical Exam Blood pressure 119/74, pulse 67, height 5\' 5"  (1.651 m), weight 54.4 kg, currently breastfeeding. Gen: NAD Abd: Soft, nontender and nondistended Pelvic: Normal appearing external genitalia; normal appearing vaginal mucosa and cervix.  IUD strings visualized, about 1 cm in length outside cervix.   Assessment & Plan:  Normal IUD check. Patient to keep IUD in place for five years; can come in for removal if she desires pregnancy within the next five years. Routine preventative health maintenance measures emphasized.  Sharen CounterLisa Leftwich-Kirby, CNM 4:21 PM

## 2018-08-04 NOTE — Patient Instructions (Signed)
Intrauterine Device Insertion, Care After    This sheet gives you information about how to care for yourself after your procedure. Your health care provider may also give you more specific instructions. If you have problems or questions, contact your health care provider.  What can I expect after the procedure?  After the procedure, it is common to have:  · Cramps and pain in the abdomen.  · Light bleeding (spotting) or heavier bleeding that is like your menstrual period. This may last for up to a few days.  · Lower back pain.  · Dizziness.  · Headaches.  · Nausea.  Follow these instructions at home:  · Before resuming sexual activity, check to make sure that you can feel the IUD string(s). You should be able to feel the end of the string(s) below the opening of your cervix. If your IUD string is in place, you may resume sexual activity.  ? If you had a hormonal IUD inserted more than 7 days after your most recent period started, you will need to use a backup method of birth control for 7 days after IUD insertion. Ask your health care provider whether this applies to you.  · Continue to check that the IUD is still in place by feeling for the string(s) after every menstrual period, or once a month.  · Take over-the-counter and prescription medicines only as told by your health care provider.  · Do not drive or use heavy machinery while taking prescription pain medicine.  · Keep all follow-up visits as told by your health care provider. This is important.  Contact a health care provider if:  · You have bleeding that is heavier or lasts longer than a normal menstrual cycle.  · You have a fever.  · You have cramps or abdominal pain that get worse or do not get better with medicine.  · You develop abdominal pain that is new or is not in the same area of earlier cramping and pain.  · You feel lightheaded or weak.  · You have abnormal or bad-smelling discharge from your vagina.  · You have pain during sexual  activity.  · You have any of the following problems with your IUD string(s):  ? The string bothers or hurts you or your sexual partner.  ? You cannot feel the string.  ? The string has gotten longer.  · You can feel the IUD in your vagina.  · You think you may be pregnant, or you miss your menstrual period.  · You think you may have an STI (sexually transmitted infection).  Get help right away if:  · You have flu-like symptoms.  · You have a fever and chills.  · You can feel that your IUD has slipped out of place.  Summary  · After the procedure, it is common to have cramps and pain in the abdomen. It is also common to have light bleeding (spotting) or heavier bleeding that is like your menstrual period.  · Continue to check that the IUD is still in place by feeling for the string(s) after every menstrual period, or once a month.  · Keep all follow-up visits as told by your health care provider. This is important.  · Contact your health care provider if you have problems with your IUD string(s), such as the string getting longer or bothering you or your sexual partner.  This information is not intended to replace advice given to you by your health care provider. Make   sure you discuss any questions you have with your health care provider.  Document Released: 03/06/2011 Document Revised: 05/29/2016 Document Reviewed: 05/29/2016  Elsevier Interactive Patient Education © 2019 [employer]

## 2019-05-06 DIAGNOSIS — Z23 Encounter for immunization: Secondary | ICD-10-CM | POA: Diagnosis not present

## 2019-06-14 DIAGNOSIS — Z20828 Contact with and (suspected) exposure to other viral communicable diseases: Secondary | ICD-10-CM | POA: Diagnosis not present

## 2019-07-06 DIAGNOSIS — Z20828 Contact with and (suspected) exposure to other viral communicable diseases: Secondary | ICD-10-CM | POA: Diagnosis not present

## 2019-07-20 ENCOUNTER — Ambulatory Visit (INDEPENDENT_AMBULATORY_CARE_PROVIDER_SITE_OTHER): Payer: BC Managed Care – PPO | Admitting: Osteopathic Medicine

## 2019-07-20 ENCOUNTER — Other Ambulatory Visit: Payer: Self-pay

## 2019-07-20 ENCOUNTER — Encounter: Payer: Self-pay | Admitting: Osteopathic Medicine

## 2019-07-20 VITALS — BP 111/73 | HR 73 | Temp 97.6°F | Wt 120.0 lb

## 2019-07-20 DIAGNOSIS — Z0000 Encounter for general adult medical examination without abnormal findings: Secondary | ICD-10-CM

## 2019-07-20 MED ORDER — FLUCONAZOLE 150 MG PO TABS: 150.0000 mg | ORAL_TABLET | Freq: Once | ORAL | 1 refills | Status: AC

## 2019-07-20 NOTE — Progress Notes (Signed)
HPI: Maria Kelly is a 28 y.o. female who  has a past medical history of Indication for care in labor or delivery (12/19/2016), Psoriasis, Psoriatic arthritis ([place]), and Scoliosis.  she presents to [facility] today, 07/20/19,  for chief complaint of: Annual physical     Patient here for annual physical / wellness exam.  See preventive care reviewed as below.  Recent labs reviewed in detail with the patient.   Additional concerns today include:  Few more moles since pregnancy Thinks might be getting a yeast infection     Past medical, surgical, social and family history reviewed:  Patient Active Problem List   Diagnosis Date Noted  . IUD (intrauterine device) in place 08/04/2018  . Fracture of forearm, left, closed 09/01/2017  . Fracture of forearm, right, closed 09/01/2017  . Scoliosis 05/27/2016  . Psoriasis 02/05/2016    Past Surgical History:  Procedure Laterality Date  . NASAL SEPTUM SURGERY    . NASAL SINUS SURGERY      Social History   Tobacco Use  . Smoking status: Never Smoker  . Smokeless tobacco: Never Used  Substance Use Topics  . Alcohol use: No    Alcohol/week: 0.0 standard drinks    Comment: occassional    Family History  Problem Relation Age of Onset  . Heart attack Paternal Grandfather   . Cancer Paternal Grandfather        brain     Current medication list and allergy/intolerance information reviewed:    Current Outpatient Medications  Medication Sig Dispense Refill  . Levonorgestrel (LILETTA, 52 MG,) 19.5 MCG/DAY IUD IUD 1 each by Intrauterine route once.    . Multiple Vitamin (MULTIVITAMIN) tablet Take 1 tablet by mouth daily.    . fluconazole (DIFLUCAN) 150 MG tablet Take 1 tablet (150 mg total) by mouth once for 1 dose. Repeat dose 72 hours if yeast infection persists 2 tablet 1   No current facility-administered medications for this visit.    Allergies  Allergen Reactions  . Keflex  [Cephalexin] Hives      Review of Systems:  Constitutional:  No  fever, no chills, No recent illness, No unintentional weight changes. No significant fatigue.   HEENT: No  headache, no vision change, no hearing change, No sore throat, No  sinus pressure  Cardiac: No  chest pain, No  pressure, No palpitations, No  Orthopnea  Respiratory:  No  shortness of breath. No  Cough  Gastrointestinal: No  abdominal pain, No  nausea, No  vomiting,  No  blood in stool, No  diarrhea, No  constipation   Musculoskeletal: No new myalgia/arthralgia  Skin: No  Rash, No other wounds/concerning lesions, moles not really concerning per patient  Genitourinary: No  incontinence, No  abnormal genital bleeding, No abnormal genital discharge  Hem/Onc: No  easy bruising/bleeding, No  abnormal lymph node  Endocrine: No cold intolerance,  No heat intolerance. No polyuria/polydipsia/polyphagia   Neurologic: No  weakness, No  dizziness, No  slurred speech/focal weakness/facial droop  Psychiatric: No  concerns with depression, No  concerns with anxiety, No sleep problems, No mood problems  Exam:  BP 111/73 (BP Location: Left Arm, Patient Position: Sitting, Cuff Size: Normal)   Pulse 73   Temp 97.6 F (36.4 C) (Oral)   Wt 120 lb (54.4 kg)   BMI 19.97 kg/m   Constitutional: VS see above. General Appearance: alert, well-developed, well-nourished, NAD  Eyes: Normal lids and conjunctive, non-icteric sclera  Ears, Nose, Mouth,  Throat: Mask in place. TM normal bilaterally.   Neck: No masses, trachea midline. No thyroid enlargement. No tenderness/mass appreciated. No lymphadenopathy  Respiratory: Normal respiratory effort. no wheeze, no rhonchi, no rales  Cardiovascular: S1/S2 normal, no murmur, no rub/gallop auscultated. RRR. No lower extremity edema.   Gastrointestinal: Nontender, no masses. No hepatomegaly, no splenomegaly. No hernia appreciated. Bowel sounds normal. Rectal exam deferred.    Musculoskeletal: Gait normal. No clubbing/cyanosis of digits.   Neurological: Normal balance/coordination. No tremor. No cranial nerve deficit on limited exam. Motor and sensation intact and symmetric. Cerebellar reflexes intact.   Skin: warm, dry, intact. No rash/ulcer. No concerning nevi or subq nodules on limited exam.    Psychiatric: Normal judgment/insight. Normal mood and affect. Oriented x3.    No results found for this or any previous visit (from the past 72 hour(s)).  No results found.   ASSESSMENT/PLAN: The encounter diagnosis was Annual physical exam.   Let me know if derm referral desired, pt would like to hold off for now, no concerning moles on limited skin exam   Diflucan sent for yeast infection   Orders Placed This Encounter  Procedures  . CBC  . COMPLETE METABOLIC PANEL WITH GFR  . LIPID SCREENING    Meds ordered this encounter  Medications  . fluconazole (DIFLUCAN) 150 MG tablet    Sig: Take 1 tablet (150 mg total) by mouth once for 1 dose. Repeat dose 72 hours if yeast infection persists    Dispense:  2 tablet    Refill:  1    Patient Instructions  General Preventive Care  Most recent routine screening lipids/other labs: ordered today!   Everyone should have blood pressure checked once per year.   Tobacco: don't!   Alcohol: responsible moderation is ok for most adults - if you have concerns about your alcohol intake, please talk to me!   Exercise: as tolerated to reduce risk of cardiovascular disease and diabetes. Strength training will also prevent osteoporosis.   Mental health: if need for mental health care (medicines, counseling, other), or concerns about moods, please let me know!   Sexual health: if need for STD testing, or if concerns with libido/pain problems, please let me know! If you need to discuss your birth control options, please let me know!   Advanced Directive: Living Will and/or Healthcare Power of Attorney recommended  for all adults, regardless of age or health.  Vaccines  Flu vaccine: recommended for almost everyone, every fall.   Shingles vaccine: recommended after age 50  Pneumonia vaccines: recommended after age 29  Tetanus booster: Tdap recommended every 10 years / 3rd trimester pregnancy. Done 08/2016 Cancer screenings   Colon cancer screening: recommended for everyone at age 9, but some folks need a colonoscopy sooner if risk factors   Breast cancer screening: mammogram recommended at age 7   Cervical cancer screening: Pap every 1 to 5 years depending on age and other risk factors.   Lung cancer screening: not needed for nonsmokers  Infection screenings . HIV,Gonorrhea/Chlamydia: screening as needed . Hepatitis C: recommended for anyone born 83-1967 . TB: certain at-risk populations, or depending on work requirements and/or travel history Other . Bone Density Test: recommended for women at age 92        Visit summary with medication list and pertinent instructions was printed for patient to review. All questions at time of visit were answered - patient instructed to contact office with any additional concerns or updates. ER/RTC precautions were reviewed with  the patient.     Please note: voice recognition software was used to produce this document, and typos may escape review. Please contact Dr. Lyn HollingsheadAlexander for any needed clarifications.     Follow-up plan: Return for ANNUAL (call week prior to visit for lab orders).

## 2019-07-20 NOTE — Patient Instructions (Addendum)
General Preventive Care  Most recent routine screening lipids/other labs: ordered today!   Everyone should have blood pressure checked once per year.   Tobacco: don't!   Alcohol: responsible moderation is ok for most adults - if you have concerns about your alcohol intake, please talk to me!   Exercise: as tolerated to reduce risk of cardiovascular disease and diabetes. Strength training will also prevent osteoporosis.   Mental health: if need for mental health care (medicines, counseling, other), or concerns about moods, please let me know!   Sexual health: if need for STD testing, or if concerns with libido/pain problems, please let me know! If you need to discuss your birth control options, please let me know!   Advanced Directive: Living Will and/or Healthcare Power of Attorney recommended for all adults, regardless of age or health.  Vaccines  Flu vaccine: recommended for almost everyone, every fall.   Shingles vaccine: recommended after age 66  Pneumonia vaccines: recommended after age 66  Tetanus booster: Tdap recommended every 10 years / 3rd trimester pregnancy. Done 08/2016 Cancer screenings   Colon cancer screening: recommended for everyone at age 32, but some folks need a colonoscopy sooner if risk factors   Breast cancer screening: mammogram recommended at age 73   Cervical cancer screening: Pap every 1 to 5 years depending on age and other risk factors.   Lung cancer screening: not needed for nonsmokers  Infection screenings . HIV,Gonorrhea/Chlamydia: screening as needed . Hepatitis C: recommended for anyone born 30-1967 . TB: certain at-risk populations, or depending on work requirements and/or travel history Other . Bone Density Test: recommended for women at age 24

## 2019-07-21 LAB — COMPLETE METABOLIC PANEL WITH GFR
AG Ratio: 1.8 (calc) (ref 1.0–2.5)
ALT: 11 U/L (ref 6–29)
AST: 16 U/L (ref 10–30)
Albumin: 4.4 g/dL (ref 3.6–5.1)
Alkaline phosphatase (APISO): 36 U/L (ref 31–125)
BUN: 16 mg/dL (ref 7–25)
CO2: 25 mmol/L (ref 20–32)
Calcium: 9.4 mg/dL (ref 8.6–10.2)
Chloride: 102 mmol/L (ref 98–110)
Creat: 0.7 mg/dL (ref 0.50–1.10)
GFR, Est African American: 135 mL/min/{1.73_m2} (ref >=60)
GFR, Est Non African American: 116 mL/min/{1.73_m2} (ref >=60)
Globulin: 2.4 g/dL (calc) (ref 1.9–3.7)
Glucose, Bld: 79 mg/dL (ref 65–99)
Potassium: 4.1 mmol/L (ref 3.5–5.3)
Sodium: 137 mmol/L (ref 135–146)
Total Bilirubin: 0.8 mg/dL (ref 0.2–1.2)
Total Protein: 6.8 g/dL (ref 6.1–8.1)

## 2019-07-21 LAB — CBC
HCT: 40.8 % (ref 35.0–45.0)
Hemoglobin: 13.6 g/dL (ref 11.7–15.5)
MCH: 29.3 pg (ref 27.0–33.0)
MCHC: 33.3 g/dL (ref 32.0–36.0)
MCV: 87.9 fL (ref 80.0–100.0)
MPV: 10.7 fL (ref 7.5–12.5)
Platelets: 186 10*3/uL (ref 140–400)
RBC: 4.64 10*6/uL (ref 3.80–5.10)
RDW: 11.7 % (ref 11.0–15.0)
WBC: 8 10*3/uL (ref 3.8–10.8)

## 2019-07-21 LAB — LIPID PANEL
Cholesterol: 172 mg/dL (ref <200)
HDL: 59 mg/dL (ref >=50)
LDL Cholesterol (Calc): 99 mg/dL (calc)
Non-HDL Cholesterol (Calc): 113 mg/dL (calc) (ref <130)
Total CHOL/HDL Ratio: 2.9 (calc) (ref <5.0)
Triglycerides: 47 mg/dL (ref <150)

## 2019-08-05 ENCOUNTER — Encounter: Payer: Self-pay | Admitting: Osteopathic Medicine

## 2019-10-25 ENCOUNTER — Other Ambulatory Visit: Payer: Self-pay

## 2019-10-25 ENCOUNTER — Other Ambulatory Visit (HOSPITAL_COMMUNITY)
Admission: RE | Admit: 2019-10-25 | Discharge: 2019-10-25 | Disposition: A | Discharge status: Home / Self Care | Payer: BC Managed Care – PPO | Source: Ambulatory Visit | Attending: Certified Nurse Midwife | Admitting: Certified Nurse Midwife

## 2019-10-25 ENCOUNTER — Ambulatory Visit (INDEPENDENT_AMBULATORY_CARE_PROVIDER_SITE_OTHER): Payer: BC Managed Care – PPO | Admitting: Certified Nurse Midwife

## 2019-10-25 ENCOUNTER — Encounter: Payer: Self-pay | Admitting: Certified Nurse Midwife

## 2019-10-25 VITALS — BP 111/68 | HR 69 | Ht 65.0 in | Wt 121.0 lb

## 2019-10-25 DIAGNOSIS — Z975 Presence of (intrauterine) contraceptive device: Secondary | ICD-10-CM

## 2019-10-25 DIAGNOSIS — N898 Other specified noninflammatory disorders of vagina: Secondary | ICD-10-CM | POA: Diagnosis not present

## 2019-10-25 DIAGNOSIS — B373 Candidiasis of vulva and vagina: Secondary | ICD-10-CM | POA: Insufficient documentation

## 2019-10-25 DIAGNOSIS — Z01419 Encounter for gynecological examination (general) (routine) without abnormal findings: Secondary | ICD-10-CM

## 2019-10-25 NOTE — Progress Notes (Addendum)
Gynecology Annual Exam   History of Present Illness: Maria Kelly is a 29 y.o. married female presenting for an annual exam. She has complaints today of clear and at times yellow vaginal discharge. There is itching and malodor. No new sexual partner. No new soaps, detergents, lubricants, or skin care products. She is sexually active. She denies dyspareunia. She does perform self breast exams. There is no notable family history of breast or ovarian cancer in her family.   Past Medical History:  Past Medical History:  Diagnosis Date  . Indication for care in labor or delivery 12/19/2016  . Psoriasis   . Psoriatic arthritis (HCC)   . Scoliosis     Past Surgical History:  Past Surgical History:  Procedure Laterality Date  . NASAL SEPTUM SURGERY    . NASAL SINUS SURGERY      Gynecologic History:  LMP: Patient's last menstrual period was 10/08/2019. Average Interval: irregular Heavy Menses: no Clots: no Intermenstrual Bleeding: no Postcoital Bleeding: no Dysmenorrhea: no Contraception: IUD Last Pap: completed on 2019 ; result was: no abnormalities   Obstetric History: G1P1001  Family History:  Family History  Problem Relation Age of Onset  . Heart attack Paternal Grandfather   . Cancer Paternal Grandfather        brain    Social History:  Social History   Socioeconomic History  . Marital status: Married    Spouse name: Not on file  . Number of children: Not on file  . Years of education: Not on file  . Highest education level: Not on file  Occupational History  . Occupation: [employer]   Tobacco Use  . Smoking status: Never Smoker  . Smokeless tobacco: Never Used  Substance and Sexual Activity  . Alcohol use: No    Alcohol/week: 0.0 standard drinks    Comment: occassional  . Drug use: No  . Sexual activity: Yes    Partners: Male    Birth control/protection: Pill, I.U.D.  Other Topics Concern  . Not on file  Social History Narrative  . Not on file   Social  Determinants of Health   Financial Resource Strain:   . Difficulty of Paying Living Expenses:   Food Insecurity:   . Worried About Programme researcher, broadcasting/film/video in the Last Year:   . Barista in the Last Year:   Transportation Needs:   . Freight forwarder (Medical):   Marland Kitchen Lack of Transportation (Non-Medical):   Physical Activity:   . Days of Exercise per Week:   . Minutes of Exercise per Session:   Stress:   . Feeling of Stress :   Social Connections:   . Frequency of Communication with Friends and Family:   . Frequency of Social Gatherings with Friends and Family:   . Attends Religious Services:   . Active Member of Clubs or Organizations:   . Attends Banker Meetings:   Marland Kitchen Marital Status:   Intimate Partner Violence:   . Fear of Current or Ex-Partner:   . Emotionally Abused:   Marland Kitchen Physically Abused:   . Sexually Abused:     Allergies:  Allergies  Allergen Reactions  . Keflex [Cephalexin] Hives    Medications: Prior to Admission medications   Medication Sig Start Date End Date Taking? Authorizing Provider  Levonorgestrel (LILETTA, 52 MG,) 19.5 MCG/DAY IUD IUD 1 each by Intrauterine route once.   Yes [provider]  Multiple Vitamin (MULTIVITAMIN) tablet Take 1 tablet by mouth daily.  [provider]    Review of Systems: negative except noted in HPI  Physical Exam Vitals: BP 111/68   Pulse 69   Ht 5\' 5"  (1.651 m)   Wt 121 lb (54.9 kg)   LMP 10/08/2019   Breastfeeding No   BMI 20.14 kg/m  General: NAD HEENT: normocephalic, atraumatic Thyroid: no enlargement, no palpable nodules Pulmonary: Normal rate and effort, CTAB Cardiovascular: RRR Breast: Breast symmetrical, no tenderness, no palpable nodules or masses, no skin or nipple retraction present, no nipple discharge. No axillary or supraclavicular lymphadenopathy. Abdomen: soft, non-tender, non-distended. No hepatomegaly, splenomegaly or masses palpable. No evidence of hernia   Genitourinary:  External: Normal external female genitalia. Normal urethral meatus  Vagina: Normal vaginal mucosa, no evidence of prolapse, scant thin white discharge   Cervix: Grossly normal in appearance, no bleeding, IUD string seen  Uterus: deferred  Adnexa: deferred  Rectal: deferred Extremities: no edema, erythema, or tenderness Neurologic: Grossly intact Psychiatric: mood appropriate, affect full  Female chaperone present for pelvic and breast portions of the physical exam  Assessment:  1. Vaginal discharge   2. Well woman exam     Plan: Aptima swab today Follow up with GYN in 1 year or prn Follow up with PCP as scheduled  Julianne Handler, CNM 10/25/2019 1:33 PM

## 2019-10-26 ENCOUNTER — Other Ambulatory Visit: Payer: Self-pay | Admitting: Certified Nurse Midwife

## 2019-10-26 DIAGNOSIS — B3731 Acute candidiasis of vulva and vagina: Secondary | ICD-10-CM

## 2019-10-26 DIAGNOSIS — B373 Candidiasis of vulva and vagina: Secondary | ICD-10-CM

## 2019-10-26 LAB — CERVICOVAGINAL ANCILLARY ONLY
Bacterial Vaginitis (gardnerella): NEGATIVE
Candida Glabrata: NEGATIVE
Candida Vaginitis: POSITIVE — AB
Chlamydia: NEGATIVE
Comment: NEGATIVE
Comment: NEGATIVE
Comment: NEGATIVE
Comment: NEGATIVE
Comment: NEGATIVE
Comment: NORMAL
Neisseria Gonorrhea: NEGATIVE
Trichomonas: NEGATIVE

## 2019-10-26 MED ORDER — FLUCONAZOLE 150 MG PO TABS: 150.0000 mg | ORAL_TABLET | Freq: Once | ORAL | 0 refills | Status: AC

## 2020-04-16 DIAGNOSIS — Z20822 Contact with and (suspected) exposure to covid-19: Secondary | ICD-10-CM | POA: Diagnosis not present

## 2020-04-16 DIAGNOSIS — R0981 Nasal congestion: Secondary | ICD-10-CM | POA: Diagnosis not present

## 2020-04-16 DIAGNOSIS — R52 Pain, unspecified: Secondary | ICD-10-CM | POA: Diagnosis not present

## 2020-04-16 DIAGNOSIS — R05 Cough: Secondary | ICD-10-CM | POA: Diagnosis not present
